# Patient Record
Sex: Female | Born: 1990 | Race: White | Hispanic: No | Marital: Married | Smoking: Former smoker
Health system: Southern US, Community
[De-identification: ages and names within clinical notes are randomized; demographics above are authoritative.]

## PROBLEM LIST (undated history)

## (undated) ENCOUNTER — Inpatient Hospital Stay (HOSPITAL_COMMUNITY): Payer: Self-pay

## (undated) DIAGNOSIS — F329 Major depressive disorder, single episode, unspecified: Secondary | ICD-10-CM

## (undated) DIAGNOSIS — F909 Attention-deficit hyperactivity disorder, unspecified type: Secondary | ICD-10-CM

## (undated) DIAGNOSIS — K219 Gastro-esophageal reflux disease without esophagitis: Secondary | ICD-10-CM

## (undated) DIAGNOSIS — E063 Autoimmune thyroiditis: Secondary | ICD-10-CM

## (undated) DIAGNOSIS — B2790 Infectious mononucleosis, unspecified without complication: Secondary | ICD-10-CM

## (undated) DIAGNOSIS — F32A Depression, unspecified: Secondary | ICD-10-CM

## (undated) DIAGNOSIS — K589 Irritable bowel syndrome without diarrhea: Secondary | ICD-10-CM

## (undated) DIAGNOSIS — Z789 Other specified health status: Secondary | ICD-10-CM

## (undated) HISTORY — DX: Irritable bowel syndrome, unspecified: K58.9

## (undated) HISTORY — DX: Major depressive disorder, single episode, unspecified: F32.9

## (undated) HISTORY — PX: NO PAST SURGERIES: SHX2092

## (undated) HISTORY — PX: WISDOM TOOTH EXTRACTION: SHX21

## (undated) HISTORY — DX: Infectious mononucleosis, unspecified without complication: B27.90

## (undated) HISTORY — DX: Autoimmune thyroiditis: E06.3

## (undated) HISTORY — DX: Depression, unspecified: F32.A

---

## 2013-02-16 DIAGNOSIS — B2790 Infectious mononucleosis, unspecified without complication: Secondary | ICD-10-CM

## 2013-02-16 HISTORY — DX: Infectious mononucleosis, unspecified without complication: B27.90

## 2013-10-13 LAB — CYTOLOGY - PAP: PAP SMEAR: NORMAL

## 2015-05-14 DIAGNOSIS — Z3483 Encounter for supervision of other normal pregnancy, third trimester: Secondary | ICD-10-CM

## 2015-11-07 LAB — OB RESULTS CONSOLE TSH: TSH: 0.19

## 2015-11-07 LAB — OB RESULTS CONSOLE PLATELET COUNT: PLATELETS: 197 10*3/uL

## 2015-11-07 LAB — OB RESULTS CONSOLE GC/CHLAMYDIA
CHLAMYDIA, DNA PROBE: NEGATIVE
GC PROBE AMP, GENITAL: NEGATIVE

## 2015-11-07 LAB — OB RESULTS CONSOLE ABO/RH: RH Type: POSITIVE

## 2015-11-07 LAB — OB RESULTS CONSOLE HGB/HCT, BLOOD
HEMATOCRIT: 41 %
HEMOGLOBIN: 13.8 g/dL

## 2015-11-07 LAB — OB RESULTS CONSOLE RUBELLA ANTIBODY, IGM: Rubella: IMMUNE

## 2015-11-07 LAB — OB RESULTS CONSOLE HIV ANTIBODY (ROUTINE TESTING): HIV: NONREACTIVE

## 2015-11-07 LAB — OB RESULTS CONSOLE ANTIBODY SCREEN: Antibody Screen: NEGATIVE

## 2015-11-07 LAB — OB RESULTS CONSOLE RPR: RPR: NONREACTIVE

## 2015-11-07 LAB — OB RESULTS CONSOLE HEPATITIS B SURFACE ANTIGEN: Hepatitis B Surface Ag: NEGATIVE

## 2015-11-27 LAB — THYROGLOBULIN ANTIBODY: THYROGLOBULIN ANTIBODY: 2.6

## 2016-01-14 ENCOUNTER — Encounter: Payer: Self-pay | Admitting: *Deleted

## 2016-01-16 ENCOUNTER — Encounter: Payer: Self-pay | Admitting: *Deleted

## 2016-01-16 DIAGNOSIS — Z3490 Encounter for supervision of normal pregnancy, unspecified, unspecified trimester: Secondary | ICD-10-CM | POA: Insufficient documentation

## 2016-01-16 DIAGNOSIS — Z3482 Encounter for supervision of other normal pregnancy, second trimester: Secondary | ICD-10-CM

## 2016-01-27 ENCOUNTER — Encounter: Payer: Self-pay | Admitting: Student

## 2016-01-27 ENCOUNTER — Ambulatory Visit (INDEPENDENT_AMBULATORY_CARE_PROVIDER_SITE_OTHER): Payer: BLUE CROSS/BLUE SHIELD | Admitting: Student

## 2016-01-27 ENCOUNTER — Ambulatory Visit (INDEPENDENT_AMBULATORY_CARE_PROVIDER_SITE_OTHER): Payer: BLUE CROSS/BLUE SHIELD | Admitting: Clinical

## 2016-01-27 VITALS — BP 114/60 | HR 69 | Ht 67.0 in | Wt 167.0 lb

## 2016-01-27 DIAGNOSIS — Z3490 Encounter for supervision of normal pregnancy, unspecified, unspecified trimester: Secondary | ICD-10-CM

## 2016-01-27 DIAGNOSIS — F329 Major depressive disorder, single episode, unspecified: Secondary | ICD-10-CM | POA: Insufficient documentation

## 2016-01-27 DIAGNOSIS — E039 Hypothyroidism, unspecified: Secondary | ICD-10-CM

## 2016-01-27 DIAGNOSIS — F330 Major depressive disorder, recurrent, mild: Secondary | ICD-10-CM

## 2016-01-27 DIAGNOSIS — F32A Depression, unspecified: Secondary | ICD-10-CM | POA: Insufficient documentation

## 2016-01-27 DIAGNOSIS — E063 Autoimmune thyroiditis: Secondary | ICD-10-CM

## 2016-01-27 DIAGNOSIS — O99282 Endocrine, nutritional and metabolic diseases complicating pregnancy, second trimester: Secondary | ICD-10-CM

## 2016-01-27 LAB — POCT URINALYSIS DIP (DEVICE)
Bilirubin Urine: NEGATIVE
Glucose, UA: NEGATIVE mg/dL
HGB URINE DIPSTICK: NEGATIVE
Ketones, ur: NEGATIVE mg/dL
NITRITE: NEGATIVE
PH: 7.5 (ref 5.0–8.0)
PROTEIN: 30 mg/dL — AB
Specific Gravity, Urine: 1.02 (ref 1.005–1.030)
UROBILINOGEN UA: 0.2 mg/dL (ref 0.0–1.0)

## 2016-01-27 LAB — TSH: TSH: 0.82 m[IU]/L

## 2016-01-27 LAB — T4, FREE: Free T4: 0.9 ng/dL (ref 0.8–1.8)

## 2016-01-27 LAB — T3, FREE: T3, Free: 2.9 pg/mL (ref 2.3–4.2)

## 2016-01-27 NOTE — Progress Notes (Signed)
Here for first visit. Transferring care from [facility]. New patient education booklets given.  Declines flu shot. Signed up for Babys[employer]s app.

## 2016-01-27 NOTE — Progress Notes (Addendum)
   PRENATAL VISIT NOTE  Subjective:  Valerie Haynes is a 25 y.o. G2P1001 at 6042w1d being seen today for ongoing prenatal care.  She is currently monitored for the following issues for this low-risk pregnancy and has Supervision of normal pregnancy; Depression; and Hashimoto's disease on her problem list.  Patient reports no complaints.  Contractions: Not present. Vag. Bleeding: None.  Movement: Present. Denies leaking of fluid.   The following portions of the patient's history were reviewed and updated as appropriate: allergies, current medications, past family history, past medical history, past social history, past surgical history and problem list. Problem list updated.  Objective:   Vitals:   01/27/16 0817 01/27/16 0835  BP: 114/60   Pulse: 69   Weight: 167 lb (75.8 kg)   Height:  5\' 7"  (1.702 m)    Fetal Status: Fetal Heart Rate (bpm): 154 Fundal Height: 21 cm Movement: Present     General:  Alert, oriented and cooperative. Patient is in no acute distress.  Skin: Skin is warm and dry. No rash noted.   Cardiovascular: Normal heart rate noted  Respiratory: Normal respiratory effort, no problems with respiration noted  Abdomen: Soft, gravid, appropriate for gestational age. Pain/Pressure: Absent     Pelvic:  Cervical exam deferred        Extremities: Normal range of motion.  Edema: None  Mental Status: Normal mood and affect. Normal behavior. Normal judgment and thought content.   Assessment and Plan:  Pregnancy: G2P1001 at 5342w1d  1. Encounter for supervision of normal pregnancy, antepartum, unspecified gravidity -Feels good - US MFM OB COMP + 14 WK; Future (on 01/27/2017) - Pain Mgmt, Profile 6 Conf w/o mM, U  2. Mild episode of recurrent major depressive disorder (HCC) -saw Asher MuirJamie today; will discuss finding a therapist  3. Hypothyroidism affecting pregnancy in second trimester -possible Hypothyroidism, will wait to see what labs on 12/11 show 4. Hashimoto's  disease Patient has been diagnosed with Hashimotos but not yet hypothyroidism. Will draw thyroid labs today. (TSH, T3, free, T4) -continue to take ASA  Patient's thyroid testing results are as follows (abstracted from prenatal records)  1. 11/07/2015 TSH 0.19 T4 1.25  2. 11/27/2015 Thyroid antibodies: 2.6    Preterm labor symptoms and general obstetric precautions including but not limited to vaginal bleeding, contractions, leaking of fluid and fetal movement were reviewed in detail with the patient. Please refer to After Visit Summary for other counseling recommendations.  Return in about 3 weeks (around 02/17/2016) for next visit fasting 2 hr gtt/ obfu.   Marylene LandKathryn Lorraine Kooistra, CNM

## 2016-01-27 NOTE — BH Specialist Note (Addendum)
Session Start time: 9:30   End Time: 9:46 Total Time:  16 minutes Type of Service: Behavioral Health - Individual/Family Interpreter: No.   Interpreter Name & Language: n/a # [facility]   SUBJECTIVE: Valerie Haynes is a 25 y.o. female  Pt. was referred by Luna KitchensKathryn Kooistra, CNM for:  depression. Pt. reports the following symptoms/concerns: Pt states that she wants to find out options for therapy in the area, as DBT was most helpful in the past; pt open to additional educational resources concerning coping with symptoms of depression(and mild anxiety) associated with overeating.Pt somewhat stressed over move to [place], and not having social support with second pregnancy and toddler at home. 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 Pt may benefit from psychoeducation and brief therapeutic interventions regarding coping with symptoms of depression and anxiety.      PLAN: 1. F/U with behavioral health clinician: As needed 2. Behavioral Health meds: none 3. Behavioral recommendations:  -Follow-up on previous referral to DBT therapist in area -Contact [employer] to ask for list of BH providers in area -Read educational material regarding coping with symptoms of  anxiety and depression -Consider [facility] and [facility] support classes for additional social support 4. Referral: Brief Counseling/Psychotherapy, Publishing rights managerCommunity Resource and Psychoeducation 5. From scale of 1-10, how likely are you to follow plan: 9   Rae LipsJamie C Mcmannes LCSWA Behavioral Health Clinician  Warmhandoff:   Warm Hand Off Completed.        Depression screen PHQ 2/9 01/27/2016  Decreased Interest 1  Down, Depressed, Hopeless 1  PHQ - 2 Score 2  Altered sleeping 1  Tired, decreased energy 2  Change in appetite 2  Feeling bad or failure about yourself  1  Trouble concentrating 1  Moving slowly or fidgety/restless 0  Suicidal thoughts 0  PHQ-9 Score 9   GAD 7 : Generalized Anxiety Score 01/27/2016  Nervous, Anxious, on Edge 1  Control/stop worrying 0  Worry too much - different things 0  Trouble relaxing 0  Restless 0  Easily annoyed or irritable 1  Afraid - awful might happen 2  Total GAD 7 Score 4

## 2016-01-27 NOTE — Patient Instructions (Signed)
AREA PEDIATRIC/FAMILY PRACTICE PHYSICIANS  [facility] [address], [address] [place], [place]  [address] Phone - [phone]   Fax - [phone]  [facility] [address] [place], [place] [address] Phone - [phone]   Fax - [phone]  [facility] [address] [place], [place]  [address] Phone - [phone]   Fax - [phone]  [facility] [address] [place], [place]  [address] Phone - [phone]   Fax - [phone]  [facility] [address] [place], [place]  [address] Phone - [phone]   Fax - [phone]  [facility] [address] [place], [place]  [address] Phone - [phone]   Fax - [phone]  [facility] [address] [place], [place]  [address] Phone - [phone]   Fax - [phone]  [facility] [address] [place], [place]  [address] Phone - [phone]   Fax - [phone]  [facility] [address] [place], [place]  [address] Phone - [phone]   Fax - [phone] [facility] [address] [place], [place]  [address] Phone - [phone]   Fax - [phone]  [facility] [address] [place], [place]  [address] Phone - [phone]   Fax - [phone]  [facility] [address] [place], [place]  [address] Phone - [phone]   Fax - [phone]  [facility] [address] [place], [place]  [address] Phone - [phone]   Fax - [phone]  [facility] [address] [place], [place]  [address] Phone - [phone]  [facility] [address] [place], [place]  [address] Phone - [phone]   Fax - [phone]  [facility] [address] [place], [place]  [address] Phone - [phone]   Fax - [phone]  [facility] [address] [place], [place]  [address] Phone - [phone]   Fax - [phone]  [facility] [address] [place], [place]  [address] Phone - [phone]   Fax - [phone]   [facility] [address] [place], [place]  [address] Phone - [phone]   Fax - [phone]  [facility] [address] [place], [place]  [address] Phone - [phone]   Fax - [phone]  [facility] [address] [place], [place]  [address] Phone - [phone]   Fax - [phone]  [facility] [address] [place], [place]  [address] Phone - [phone]   Fax - [phone]  [facility] [address] , [place]  [address] Phone - [phone]   Fax - [phone] [facility] [address] [place], [place]  [address] Phone - [phone]   Fax - [phone]  [facility] [address] [place], [place]  [address] Phone - [phone]   Fax - [phone]  [facility] [address] [place], [place]  [address] Phone - [phone]   Fax - [phone]   

## 2016-01-28 ENCOUNTER — Other Ambulatory Visit: Payer: Self-pay | Admitting: Student

## 2016-01-28 ENCOUNTER — Ambulatory Visit (HOSPITAL_COMMUNITY)
Admission: RE | Admit: 2016-01-28 | Discharge: 2016-01-28 | Disposition: A | Discharge status: Home / Self Care | Payer: BLUE CROSS/BLUE SHIELD | Source: Ambulatory Visit | Attending: Student | Admitting: Student

## 2016-01-28 DIAGNOSIS — Z3A21 21 weeks gestation of pregnancy: Secondary | ICD-10-CM | POA: Insufficient documentation

## 2016-01-28 DIAGNOSIS — O358XX0 Maternal care for other (suspected) fetal abnormality and damage, not applicable or unspecified: Secondary | ICD-10-CM | POA: Diagnosis not present

## 2016-01-28 DIAGNOSIS — Z363 Encounter for antenatal screening for malformations: Secondary | ICD-10-CM | POA: Diagnosis present

## 2016-01-28 DIAGNOSIS — Z3490 Encounter for supervision of normal pregnancy, unspecified, unspecified trimester: Secondary | ICD-10-CM

## 2016-01-28 DIAGNOSIS — O359XX0 Maternal care for (suspected) fetal abnormality and damage, unspecified, not applicable or unspecified: Secondary | ICD-10-CM

## 2016-01-28 LAB — PAIN MGMT, PROFILE 6 CONF W/O MM, U
6 ACETYLMORPHINE: NEGATIVE ng/mL (ref <10)
ALCOHOL METABOLITES: NEGATIVE ng/mL (ref <500)
Amphetamines: NEGATIVE ng/mL (ref <500)
BARBITURATES: NEGATIVE ng/mL (ref <300)
Benzodiazepines: NEGATIVE ng/mL (ref <100)
COCAINE METABOLITE: NEGATIVE ng/mL (ref <150)
CREATININE: 134.4 mg/dL (ref >=20.0)
METHADONE METABOLITE: NEGATIVE ng/mL (ref <100)
Marijuana Metabolite: NEGATIVE ng/mL (ref <20)
OPIATES: NEGATIVE ng/mL (ref <100)
Oxidant: NEGATIVE ug/mL (ref <200)
Oxycodone: NEGATIVE ng/mL (ref <100)
PH: 7.62 (ref 4.5–9.0)
Phencyclidine: NEGATIVE ng/mL (ref <25)
Please note:: 0

## 2016-01-31 NOTE — Addendum Note (Signed)
Addended by: Marlis EdelsonKARIM, Rage Beever N on: 01/31/2016 11:48 AM   Modules accepted: Kipp BroodSmartSet

## 2016-02-03 ENCOUNTER — Encounter (HOSPITAL_COMMUNITY): Payer: Self-pay | Admitting: *Deleted

## 2016-02-03 ENCOUNTER — Inpatient Hospital Stay (HOSPITAL_COMMUNITY): Payer: BLUE CROSS/BLUE SHIELD

## 2016-02-03 ENCOUNTER — Inpatient Hospital Stay (HOSPITAL_COMMUNITY)
Admission: AD | Admit: 2016-02-03 | Discharge: 2016-02-03 | Disposition: A | Discharge status: Home / Self Care | Payer: BLUE CROSS/BLUE SHIELD | Source: Ambulatory Visit | Attending: Family Medicine | Admitting: Family Medicine

## 2016-02-03 DIAGNOSIS — Y92414 Local residential or business street as the place of occurrence of the external cause: Secondary | ICD-10-CM | POA: Insufficient documentation

## 2016-02-03 DIAGNOSIS — Z7982 Long term (current) use of aspirin: Secondary | ICD-10-CM | POA: Insufficient documentation

## 2016-02-03 DIAGNOSIS — O26892 Other specified pregnancy related conditions, second trimester: Secondary | ICD-10-CM | POA: Insufficient documentation

## 2016-02-03 DIAGNOSIS — Z87891 Personal history of nicotine dependence: Secondary | ICD-10-CM | POA: Diagnosis not present

## 2016-02-03 DIAGNOSIS — S3991XA Unspecified injury of abdomen, initial encounter: Secondary | ICD-10-CM

## 2016-02-03 DIAGNOSIS — O9A212 Injury, poisoning and certain other consequences of external causes complicating pregnancy, second trimester: Secondary | ICD-10-CM

## 2016-02-03 DIAGNOSIS — Z3A22 22 weeks gestation of pregnancy: Secondary | ICD-10-CM | POA: Insufficient documentation

## 2016-02-03 NOTE — Discharge Instructions (Signed)
What Do I Need to Know About Injuries During Pregnancy? °Trauma is the most common cause of injury and death in pregnant women. This can also result in significant harm or death of the baby. °Your baby is protected in the womb (uterus) by a sac filled with fluid (amniotic sac). Your baby can be harmed if there is direct, high-impact trauma to your abdomen and pelvis. This type of trauma can result in tearing of your uterus, the placenta pulling away from the wall of the uterus (placenta abruption), or the amniotic sac breaking open (rupture of membranes). These injuries can decrease or stop the blood supply to your baby or cause you to go into labor earlier than expected. Minor falls and low-impact automobile accidents do not usually harm your baby, even if they do minimally harm you. °WHAT KIND OF INJURIES CAN AFFECT MY PREGNANCY? °The most common causes of injury or death to a baby include: °· Falls. Falls are more common in the second and third trimester of the pregnancy. Factors that increase your risk of falling include: °¨ Increase in your weight. °¨ The change in your center of gravity. °¨ Tripping over an object that cannot be seen. °¨ Increased looseness (laxity) of your ligaments resulting in less coordinated movements (you may feel clumsy). °¨ Falling during high-risk activities like horseback riding or skiing. °· Automobile accidents. It is important to wear your seat belt properly, with the lap belt below your abdomen, and always practice safe driving. °· Domestic violence or assault. °· Burns (fire or electrical). °The most common causes of injury or death to the pregnant woman include: °· Injuries that cause severe bleeding, shock, and loss of blood flow to major organs. °· Head and neck injuries that result in severe brain or spinal damage. °· Chest trauma that can cause direct injury to the heart and lungs or any injury that affects the area enclosed by the ribs. Trauma to this area can result in  cardiorespiratory arrest. °WHAT CAN I DO TO PROTECT MYSELF AND MY BABY FROM INJURY WHILE I AM PREGNANT? °· Remove slippery rugs and loose objects on the floor that increase your risk of tripping. °· Avoid walking on wet or slippery floors. °· Wear comfortable shoes that have a good grip on the sole. Do not wear high-heeled shoes. °· Always wear your seat belt properly, with the lap belt below your abdomen, and always practice safe driving. Do not ride on a motorcycle while pregnant. °· Do not participate in high-impact activities or sports. °· Avoid fires, starting fires, lifting heavy pots of boiling or hot liquids, and fixing electrical problems. °· Only take over-the-counter or prescription medicines for pain, fever, or discomfort as directed by your health care provider. °· Know your blood type and the father's blood type in case you develop vaginal bleeding or experience an injury for which a blood transfusion may be necessary. °· Call your local emergency services (911 in the [place].) if you are a victim of domestic violence or assault. Spousal abuse can be a significant cause of trauma during pregnancy. For help and support, contact the National Domestic Violence Hotline. °WHEN SHOULD I SEEK IMMEDIATE MEDICAL CARE?  °· You fall on your abdomen or experience any high-force accident or injury. °· You have been assaulted (domestic or otherwise). °· You have been in a car accident. °· You develop vaginal bleeding. °· You develop fluid leaking from the vagina. °· You develop uterine contractions (pelvic cramping, pain, or significant low back   pain). °· You become weak or faint, or have uncontrolled vomiting after trauma. °· You had a serious burn. This includes burns to the face, neck, hands, or genitals, or burns greater than the size of your palm anywhere else. °· You develop neck stiffness or pain after a fall or from other trauma. °· You develop a headache or vision problems after a fall or from other  trauma. °· You do not feel the baby moving or the baby is not moving as much as before a fall or other trauma. °This information is not intended to replace advice given to you by your health care provider. Make sure you discuss any questions you have with your health care provider. °Document Released: 03/12/2004 Document Revised: 02/23/2014 Document Reviewed: 11/09/2012 °Elsevier Interactive Patient Education © 2017 Elsevier Inc. ° °
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
## 2016-02-04 ENCOUNTER — Telehealth: Payer: Self-pay | Admitting: *Deleted

## 2016-02-04 NOTE — Telephone Encounter (Signed)
Valerie Haynes left a voicemail yesterday am stating she wants to speak with the doctor who looked at her US.
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
## 2016-02-05 ENCOUNTER — Telehealth: Payer: Self-pay | Admitting: Student

## 2016-02-12 ENCOUNTER — Telehealth: Payer: Self-pay | Admitting: *Deleted

## 2016-02-12 DIAGNOSIS — Z3490 Encounter for supervision of normal pregnancy, unspecified, unspecified trimester: Secondary | ICD-10-CM

## 2016-02-12 NOTE — Telephone Encounter (Signed)
Per Dorene SorrowK. Koostrya, CNM need to schedule 4 week fu US and appt to discuss results.

## 2016-02-12 NOTE — Telephone Encounter (Signed)
Called and scheduled 1/17 2:15 pm. Valerie Lundalled Valerie Haynes to notify and was unable to leave a message- heard a message voice mail is full. Will also send message to admin pool to schedule obfu after us appt. To discuss results.

## 2016-02-17 NOTE — L&D Delivery Note (Signed)
Delivery Note At 7:36 PM a viable female was delivered via Vaginal, Spontaneous Delivery (Presentation:ROA).  APGAR: 9, 9; weight pending.   Placenta status: Spontaneous, intact.  Cord: 3 vessels.  Anesthesia: None   Episiotomy: None Lacerations: second degree  Suture Repair: 2.0 vicryl Est. Blood Loss (mL):  200  Mom to postpartum.  Baby to Couplet care / Skin to Skin.  Cleone Slim 06/07/2016, 8:40 PM

## 2016-02-18 ENCOUNTER — Ambulatory Visit (INDEPENDENT_AMBULATORY_CARE_PROVIDER_SITE_OTHER): Payer: BLUE CROSS/BLUE SHIELD | Admitting: Student

## 2016-02-18 VITALS — BP 100/51 | HR 71 | Wt 170.6 lb

## 2016-02-18 DIAGNOSIS — O99282 Endocrine, nutritional and metabolic diseases complicating pregnancy, second trimester: Secondary | ICD-10-CM

## 2016-02-18 DIAGNOSIS — O283 Abnormal ultrasonic finding on antenatal screening of mother: Secondary | ICD-10-CM | POA: Insufficient documentation

## 2016-02-18 DIAGNOSIS — Z3400 Encounter for supervision of normal first pregnancy, unspecified trimester: Secondary | ICD-10-CM

## 2016-02-18 DIAGNOSIS — E063 Autoimmune thyroiditis: Secondary | ICD-10-CM

## 2016-02-18 DIAGNOSIS — Z3482 Encounter for supervision of other normal pregnancy, second trimester: Secondary | ICD-10-CM

## 2016-02-18 LAB — CBC
HCT: 32.2 % — ABNORMAL LOW (ref 35.0–45.0)
Hemoglobin: 10.6 g/dL — ABNORMAL LOW (ref 11.7–15.5)
MCH: 29.5 pg (ref 27.0–33.0)
MCHC: 32.9 g/dL (ref 32.0–36.0)
MCV: 89.7 fL (ref 80.0–100.0)
MPV: 11.2 fL (ref 7.5–12.5)
PLATELETS: 190 10*3/uL (ref 140–400)
RBC: 3.59 MIL/uL — ABNORMAL LOW (ref 3.80–5.10)
RDW: 14.1 % (ref 11.0–15.0)
WBC: 10 10*3/uL (ref 3.8–10.8)

## 2016-02-18 NOTE — Progress Notes (Signed)
   PRENATAL VISIT NOTE  Subjective:  Valerie DoughtyKelly Mellin is a 26 y.o. G2P1001 at 4463w6d being seen today for ongoing prenatal care.  She is currently monitored for the following issues for this low-risk pregnancy and has Supervision of normal pregnancy; Depression; and Hashimoto's disease on her problem list.  Patient reports no complaints.  Contractions: Not present. Vag. Bleeding: None.  Movement: Present. Denies leaking of fluid.   The following portions of the patient's history were reviewed and updated as appropriate: allergies, current medications, past family history, past medical history, past social history, past surgical history and problem list. Problem list updated.  Objective:   Vitals:   02/18/16 0747  BP: (!) 100/51  Pulse: 71  Weight: 170 lb 9.6 oz (77.4 kg)    Fetal Status: Fetal Heart Rate (bpm): 143   Movement: Present     General:  Alert, oriented and cooperative. Patient is in no acute distress.  Skin: Skin is warm and dry. No rash noted.   Cardiovascular: Normal heart rate noted  Respiratory: Normal respiratory effort, no problems with respiration noted  Abdomen: Soft, gravid, appropriate for gestational age. Pain/Pressure: Present     Pelvic:  Cervical exam deferred        Extremities: Normal range of motion.     Mental Status: Normal mood and affect. Normal behavior. Normal judgment and thought content.   Assessment and Plan:  Pregnancy: G2P1001 at 8463w6d  1. Supervision of normal first pregnancy, antepartum Patient is doing well. TSH was 0.83 at 22 weeks; will repeat in 3rd trimester. Not seeking counseling at this point for her depression but feels she is managing well with support from family and friends.  - 2Hr GTT w/ 1 Hr Carpenter 75 g - RPR - CBC - HIV antibody (with reflex)  2. Patient has appointment on 03-04-16 for follow up MFM US to assess renal pyelectasis.   Preterm labor symptoms and general obstetric precautions including but not limited to  vaginal bleeding, contractions, leaking of fluid and fetal movement were reviewed in detail with the patient. Please refer to After Visit Summary for other counseling recommendations.   Return in about 2 weeks (around 03/03/2016) for Ob follow up.   Marylene LandKathryn Lorraine Sonja Manseau, CNM

## 2016-02-19 LAB — RPR

## 2016-02-19 LAB — 2HR GTT W 1 HR, CARPENTER, 75 G
GLUCOSE, 1 HR, GEST: 78 mg/dL (ref <180)
GLUCOSE, 2 HR, GEST: 81 mg/dL (ref <153)
Glucose, Fasting, Gest: 77 mg/dL (ref 65–91)

## 2016-02-19 LAB — HIV ANTIBODY (ROUTINE TESTING W REFLEX): HIV 1&2 Ab, 4th Generation: NONREACTIVE

## 2016-02-20 NOTE — Telephone Encounter (Signed)
Called Tresa EndoKelly to make sure she was notified at her ob visit. She voices understanding and we reviewed her appointments. Also reviewed lab results from 02/18/16 at her request.

## 2016-03-04 ENCOUNTER — Ambulatory Visit (HOSPITAL_COMMUNITY): Admission: RE | Admit: 2016-03-04 | Payer: No Typology Code available for payment source | Source: Ambulatory Visit

## 2016-03-04 ENCOUNTER — Encounter: Payer: Self-pay | Admitting: Family Medicine

## 2016-03-09 ENCOUNTER — Other Ambulatory Visit (HOSPITAL_COMMUNITY): Payer: Self-pay | Admitting: *Deleted

## 2016-03-09 ENCOUNTER — Ambulatory Visit (HOSPITAL_COMMUNITY)
Admission: RE | Admit: 2016-03-09 | Discharge: 2016-03-09 | Disposition: A | Discharge status: Home / Self Care | Payer: BLUE CROSS/BLUE SHIELD | Source: Ambulatory Visit | Attending: Student | Admitting: Student

## 2016-03-09 ENCOUNTER — Encounter (HOSPITAL_COMMUNITY): Payer: Self-pay

## 2016-03-09 ENCOUNTER — Encounter: Payer: Self-pay | Admitting: Obstetrics & Gynecology

## 2016-03-09 DIAGNOSIS — O35EXX0 Maternal care for other (suspected) fetal abnormality and damage, fetal genitourinary anomalies, not applicable or unspecified: Secondary | ICD-10-CM

## 2016-03-09 DIAGNOSIS — O358XX0 Maternal care for other (suspected) fetal abnormality and damage, not applicable or unspecified: Secondary | ICD-10-CM | POA: Diagnosis not present

## 2016-03-09 DIAGNOSIS — Z3A27 27 weeks gestation of pregnancy: Secondary | ICD-10-CM | POA: Diagnosis not present

## 2016-03-09 DIAGNOSIS — Z3490 Encounter for supervision of normal pregnancy, unspecified, unspecified trimester: Secondary | ICD-10-CM

## 2016-03-16 ENCOUNTER — Telehealth: Payer: Self-pay | Admitting: *Deleted

## 2016-03-16 NOTE — Telephone Encounter (Signed)
Called patient and let her know to continue with her baby asa till 36 weeks. Understanding voiced.

## 2016-03-16 NOTE — Telephone Encounter (Signed)
Patient called with question about taking baby asa which was prescribed by another office. She has run out and wants to know if she should continue taking. Per Dr Adrian BlackwaterStinson she should continue till [redacted] weeks gestation.

## 2016-03-17 ENCOUNTER — Ambulatory Visit (INDEPENDENT_AMBULATORY_CARE_PROVIDER_SITE_OTHER): Payer: BLUE CROSS/BLUE SHIELD | Admitting: Certified Nurse Midwife

## 2016-03-17 VITALS — BP 106/60 | HR 79 | Wt 175.7 lb

## 2016-03-17 DIAGNOSIS — E063 Autoimmune thyroiditis: Secondary | ICD-10-CM

## 2016-03-17 DIAGNOSIS — Z3483 Encounter for supervision of other normal pregnancy, third trimester: Secondary | ICD-10-CM

## 2016-03-17 DIAGNOSIS — Z23 Encounter for immunization: Secondary | ICD-10-CM | POA: Diagnosis not present

## 2016-03-17 DIAGNOSIS — O283 Abnormal ultrasonic finding on antenatal screening of mother: Secondary | ICD-10-CM

## 2016-03-17 LAB — TSH: TSH: 0.86 mIU/L

## 2016-03-17 NOTE — Addendum Note (Signed)
Addended by: Donette LarryBHAMBRI, Caoimhe Damron E on: 03/17/2016 10:13 AM   Modules accepted: Orders

## 2016-03-17 NOTE — Progress Notes (Signed)
Subjective:  Valerie DoughtyKelly Haynes is a 26 y.o. G2P1001 at 5138w6d being seen today for ongoing prenatal care.  She is currently monitored for the following issues for this low-risk pregnancy and has Supervision of normal pregnancy; Depression; Hashimoto's disease; and Abnormal pregnancy US on her problem list.  Patient reports no complaints.  Contractions: Not present. Vag. Bleeding: None.  Movement: Present. Denies leaking of fluid.   The following portions of the patient's history were reviewed and updated as appropriate: allergies, current medications, past family history, past medical history, past social history, past surgical history and problem list. Problem list updated.  Objective:   Vitals:   03/17/16 0934  BP: 106/60  Pulse: 79  Weight: 175 lb 11.2 oz (79.7 kg)    Fetal Status: Fetal Heart Rate (bpm): 135 Fundal Height: 28 cm Movement: Present  Presentation: Vertex  General:  Alert, oriented and cooperative. Patient is in no acute distress.  Skin: Skin is warm and dry. No rash noted.   Cardiovascular: Normal heart rate noted  Respiratory: Normal respiratory effort, no problems with respiration noted  Abdomen: Soft, gravid, appropriate for gestational age. Pain/Pressure: Present     Pelvic: Vag. Bleeding: None Vag D/C Character: White   Cervical exam deferred        Extremities: Normal range of motion.  Edema: None  Mental Status: Normal mood and affect. Normal behavior. Normal judgment and thought content.   Urinalysis:      Assessment and Plan:  Pregnancy: G2P1001 at 6938w6d  1. Encounter for supervision of other normal pregnancy in third trimester - interested in waterbirth, discussed class requirements-bring certificate - Tdap vaccine greater than or equal to 7yo IM  2. Hashimoto's disease - TSH - TgAb+Thyroglobulin IMA or LCMS  3. Abnormal pregnancy US - f/u US in 6 weeks  Preterm labor symptoms and general obstetric precautions including but not limited to vaginal  bleeding, contractions, leaking of fluid and fetal movement were reviewed in detail with the patient. Please refer to After Visit Summary for other counseling recommendations.  Return in about 2 weeks (around 03/31/2016).   Donette LarryMelanie Norina Cowper, CNM

## 2016-03-18 LAB — THYROGLOBULIN ANTIBODY: THYROGLOBULIN AB: 2 [IU]/mL — AB (ref <2)

## 2016-04-02 ENCOUNTER — Ambulatory Visit (INDEPENDENT_AMBULATORY_CARE_PROVIDER_SITE_OTHER): Payer: BLUE CROSS/BLUE SHIELD | Admitting: Student

## 2016-04-02 VITALS — BP 109/67 | HR 90 | Wt 173.4 lb

## 2016-04-02 DIAGNOSIS — Z3493 Encounter for supervision of normal pregnancy, unspecified, third trimester: Secondary | ICD-10-CM | POA: Insufficient documentation

## 2016-04-02 NOTE — Patient Instructions (Signed)
Third Trimester of Pregnancy The third trimester is from week 29 through week 40 (months 7 through 9). The third trimester is a time when the unborn baby (fetus) is growing rapidly. At the end of the ninth month, the fetus is about 20 inches in length and weighs 6-10 pounds. Body changes during your third trimester Your body goes through many changes during pregnancy. The changes vary from woman to woman. During the third trimester:  Your weight will continue to increase. You can expect to gain 25-35 pounds (11-16 kg) by the end of the pregnancy.  You may begin to get stretch marks on your hips, abdomen, and breasts.  You may urinate more often because the fetus is moving lower into your pelvis and pressing on your bladder.  You may develop or continue to have heartburn. This is caused by increased hormones that slow down muscles in the digestive tract.  You may develop or continue to have constipation because increased hormones slow digestion and cause the muscles that push waste through your intestines to relax.  You may develop hemorrhoids. These are swollen veins (varicose veins) in the rectum that can itch or be painful.  You may develop swollen, bulging veins (varicose veins) in your legs.  You may have increased body aches in the pelvis, back, or thighs. This is due to weight gain and increased hormones that are relaxing your joints.  You may have changes in your hair. These can include thickening of your hair, rapid growth, and changes in texture. Some women also have hair loss during or after pregnancy, or hair that feels dry or thin. Your hair will most likely return to normal after your baby is born.  Your breasts will continue to grow and they will continue to become tender. A yellow fluid (colostrum) may leak from your breasts. This is the first milk you are producing for your baby.  Your belly button may stick out.  You may notice more swelling in your hands, face, or  ankles.  You may have increased tingling or numbness in your hands, arms, and legs. The skin on your belly may also feel numb.  You may feel short of breath because of your expanding uterus.  You may have more problems sleeping. This can be caused by the size of your belly, increased need to urinate, and an increase in your body's metabolism.  You may notice the fetus "dropping," or moving lower in your abdomen.  You may have increased vaginal discharge.  Your cervix becomes thin and soft (effaced) near your due date. What to expect at prenatal visits You will have prenatal exams every 2 weeks until week 36. Then you will have weekly prenatal exams. During a routine prenatal visit:  You will be weighed to make sure you and the fetus are growing normally.  Your blood pressure will be taken.  Your abdomen will be measured to track your baby's growth.  The fetal heartbeat will be listened to.  Any test results from the previous visit will be discussed.  You may have a cervical check near your due date to see if you have effaced. At around 36 weeks, your health care provider will check your cervix. At the same time, your health care provider will also perform a test on the secretions of the vaginal tissue. This test is to determine if a type of bacteria, Group B streptococcus, is present. Your health care provider will explain this further. Your health care provider may ask you:    What your birth plan is.  How you are feeling.  If you are feeling the baby move.  If you have had any abnormal symptoms, such as leaking fluid, bleeding, severe headaches, or abdominal cramping.  If you are using any tobacco products, including cigarettes, chewing tobacco, and electronic cigarettes.  If you have any questions. Other tests or screenings that may be performed during your third trimester include:  Blood tests that check for low iron levels (anemia).  Fetal testing to check the health,  activity level, and growth of the fetus. Testing is done if you have certain medical conditions or if there are problems during the pregnancy.  Nonstress test (NST). This test checks the health of your baby to make sure there are no signs of problems, such as the baby not getting enough oxygen. During this test, a belt is placed around your belly. The baby is made to move, and its heart rate is monitored during movement. What is false labor? False labor is a condition in which you feel small, irregular tightenings of the muscles in the womb (contractions) that eventually go away. These are called Braxton Hicks contractions. Contractions may last for hours, days, or even weeks before true labor sets in. If contractions come at regular intervals, become more frequent, increase in intensity, or become painful, you should see your health care provider. What are the signs of labor?  Abdominal cramps.  Regular contractions that start at 10 minutes apart and become stronger and more frequent with time.  Contractions that start on the top of the uterus and spread down to the lower abdomen and back.  Increased pelvic pressure and dull back pain.  A watery or bloody mucus discharge that comes from the vagina.  Leaking of amniotic fluid. This is also known as your "water breaking." It could be a slow trickle or a gush. Let your doctor know if it has a color or strange odor. If you have any of these signs, call your health care provider right away, even if it is before your due date. Follow these instructions at home: Eating and drinking  Continue to eat regular, healthy meals.  Do not eat:  Raw meat or meat spreads.  Unpasteurized milk or cheese.  Unpasteurized juice.  Store-made salad.  Refrigerated smoked seafood.  Hot dogs or deli meat, unless they are piping hot.  More than 6 ounces of albacore tuna a week.  Shark, swordfish, king mackerel, or tile fish.  Store-made salads.  Raw  sprouts, such as mung bean or alfalfa sprouts.  Take prenatal vitamins as told by your health care provider.  Take 1000 mg of calcium daily as told by your health care provider.  If you develop constipation:  Take over-the-counter or prescription medicines.  Drink enough fluid to keep your urine clear or pale yellow.  Eat foods that are high in fiber, such as fresh fruits and vegetables, whole grains, and beans.  Limit foods that are high in fat and processed sugars, such as fried and sweet foods. Activity  Exercise only as directed by your health care provider. Healthy pregnant women should aim for 2 hours and 30 minutes of moderate exercise per week. If you experience any pain or discomfort while exercising, stop.  Avoid heavy lifting.  Do not exercise in extreme heat or humidity, or at high altitudes.  Wear low-heel, comfortable shoes.  Practice good posture.  Do not travel far distances unless it is absolutely necessary and only with the approval   of your health care provider.  Wear your seat belt at all times while in a car, on a bus, or on a plane.  Take frequent breaks and rest with your legs elevated if you have leg cramps or low back pain.  Do not use hot tubs, steam rooms, or saunas.  You may continue to have sex unless your health care provider tells you otherwise. Lifestyle  Do not use any products that contain nicotine or tobacco, such as cigarettes and e-cigarettes. If you need help quitting, ask your health care provider.  Do not drink alcohol.  Do not use any medicinal herbs or unprescribed drugs. These chemicals affect the formation and growth of the baby.  If you develop varicose veins:  Wear support pantyhose or compression stockings as told by your healthcare provider.  Elevate your feet for 15 minutes, 3-4 times a day.  Wear a supportive maternity bra to help with breast tenderness. General instructions  Take over-the-counter and prescription  medicines only as told by your health care provider. There are medicines that are either safe or unsafe to take during pregnancy.  Take warm sitz baths to soothe any pain or discomfort caused by hemorrhoids. Use hemorrhoid cream or witch hazel if your health care provider approves.  Avoid cat litter boxes and soil used by cats. These carry germs that can cause birth defects in the baby. If you have a cat, ask someone to clean the litter box for you.  To prepare for the arrival of your baby:  Take prenatal classes to understand, practice, and ask questions about the labor and delivery.  Make a trial run to the hospital.  Visit the hospital and tour the maternity area.  Arrange for maternity or paternity leave through employers.  Arrange for family and friends to take care of pets while you are in the hospital.  Purchase a rear-facing car seat and make sure you know how to install it in your car.  Pack your hospital bag.  Prepare the baby's nursery. Make sure to remove all pillows and stuffed animals from the baby's crib to prevent suffocation.  Visit your dentist if you have not gone during your pregnancy. Use a soft toothbrush to brush your teeth and be gentle when you floss.  Keep all prenatal follow-up visits as told by your health care provider. This is important. Contact a health care provider if:  You are unsure if you are in labor or if your water has broken.  You become dizzy.  You have mild pelvic cramps, pelvic pressure, or nagging pain in your abdominal area.  You have lower back pain.  You have persistent nausea, vomiting, or diarrhea.  You have an unusual or bad smelling vaginal discharge.  You have pain when you urinate. Get help right away if:  You have a fever.  You are leaking fluid from your vagina.  You have spotting or bleeding from your vagina.  You have severe abdominal pain or cramping.  You have rapid weight loss or weight gain.  You have  shortness of breath with chest pain.  You notice sudden or extreme swelling of your face, hands, ankles, feet, or legs.  Your baby makes fewer than 10 movements in 2 hours.  You have severe headaches that do not go away with medicine.  You have vision changes. Summary  The third trimester is from week 29 through week 40, months 7 through 9. The third trimester is a time when the unborn baby (fetus)   is growing rapidly.  During the third trimester, your discomfort may increase as you and your baby continue to gain weight. You may have abdominal, leg, and back pain, sleeping problems, and an increased need to urinate.  During the third trimester your breasts will keep growing and they will continue to become tender. A yellow fluid (colostrum) may leak from your breasts. This is the first milk you are producing for your baby.  False labor is a condition in which you feel small, irregular tightenings of the muscles in the womb (contractions) that eventually go away. These are called Braxton Hicks contractions. Contractions may last for hours, days, or even weeks before true labor sets in.  Signs of labor can include: abdominal cramps; regular contractions that start at 10 minutes apart and become stronger and more frequent with time; watery or bloody mucus discharge that comes from the vagina; increased pelvic pressure and dull back pain; and leaking of amniotic fluid. This information is not intended to replace advice given to you by your health care provider. Make sure you discuss any questions you have with your health care provider. Document Released: 01/27/2001 Document Revised: 07/11/2015 Document Reviewed: 04/05/2012 [employer] Interactive Patient Education  2017 [employer]  

## 2016-04-02 NOTE — Progress Notes (Addendum)
   PRENATAL VISIT NOTE  Subjective:  Valerie Haynes is a 26 y.o. G2P1001 at 1145w1d being seen today for ongoing prenatal care.  She is currently monitored for the following issues for this low-risk pregnancy and has Supervision of normal pregnancy; Depression; Hashimoto's disease; Abnormal pregnancy US; and Prenatal care in third trimester on her problem list.  Patient reports occasional pain in the morning when she wakes up. The pain is on her right side and it goes away after getting up. She also recently had the gi bug and lost two pounds. She feels like her mood is good. It is up and down and she feels better when talking with her husband. Doesn't feel the need to see Valerie Haynes today and says she has the list of therapists if she needs to start therapy. .  Contractions: Not present. Vag. Bleeding: None.  Movement: Present. Denies leaking of fluid.   The following portions of the patient's history were reviewed and updated as appropriate: allergies, current medications, past family history, past medical history, past social history, past surgical history and problem list. Problem list updated.  Objective:   Vitals:   04/02/16 1003  BP: 109/67  Pulse: 90  Weight: 78.7 kg (173 lb 6.4 oz)    Fetal Status: Fetal Heart Rate (bpm): 150 Fundal Height: 31 cm Movement: Present     General:  Alert, oriented and cooperative. Patient is in no acute distress.  Skin: Skin is warm and dry. No rash noted.   Cardiovascular: Normal heart rate noted  Respiratory: Normal respiratory effort, no problems with respiration noted  Abdomen: Soft, gravid, appropriate for gestational age. Pain/Pressure: Present     Pelvic:  Cervical exam deferred        Extremities: Normal range of motion.  Edema: None  Mental Status: Normal mood and affect. Normal behavior. Normal judgment and thought content.   Assessment and Plan:  Pregnancy: G2P1001 at 1345w1d  1. Prenatal care in third trimester -Reviewed her antibodies; no  need for medication at this time -Patient has follow up for renal pyelactasis on 04-21-16 -Doing well mood-wise; has list of therapists if she feels she needs talk therapy  Preterm labor symptoms and general obstetric precautions including but not limited to vaginal bleeding, contractions, leaking of fluid and fetal movement were reviewed in detail with the patient. Please refer to After Visit Summary for other counseling recommendations.  Return in about 2 weeks (around 04/16/2016).   Valerie Haynes, CNM

## 2016-04-21 ENCOUNTER — Encounter (HOSPITAL_COMMUNITY): Payer: Self-pay

## 2016-04-21 ENCOUNTER — Encounter: Payer: Self-pay | Admitting: Obstetrics and Gynecology

## 2016-04-21 ENCOUNTER — Ambulatory Visit (HOSPITAL_COMMUNITY)
Admission: RE | Admit: 2016-04-21 | Discharge: 2016-04-21 | Disposition: A | Discharge status: Home / Self Care | Payer: BLUE CROSS/BLUE SHIELD | Source: Ambulatory Visit | Attending: Student | Admitting: Student

## 2016-04-21 ENCOUNTER — Ambulatory Visit (INDEPENDENT_AMBULATORY_CARE_PROVIDER_SITE_OTHER): Payer: BLUE CROSS/BLUE SHIELD | Admitting: Obstetrics and Gynecology

## 2016-04-21 VITALS — BP 106/58 | HR 78 | Temp 98.0°F | Wt 176.7 lb

## 2016-04-21 DIAGNOSIS — O35EXX0 Maternal care for other (suspected) fetal abnormality and damage, fetal genitourinary anomalies, not applicable or unspecified: Secondary | ICD-10-CM

## 2016-04-21 DIAGNOSIS — Z3483 Encounter for supervision of other normal pregnancy, third trimester: Secondary | ICD-10-CM

## 2016-04-21 DIAGNOSIS — O358XX0 Maternal care for other (suspected) fetal abnormality and damage, not applicable or unspecified: Secondary | ICD-10-CM | POA: Diagnosis not present

## 2016-04-21 DIAGNOSIS — E063 Autoimmune thyroiditis: Secondary | ICD-10-CM

## 2016-04-21 DIAGNOSIS — O283 Abnormal ultrasonic finding on antenatal screening of mother: Secondary | ICD-10-CM

## 2016-04-21 DIAGNOSIS — Z3A33 33 weeks gestation of pregnancy: Secondary | ICD-10-CM | POA: Diagnosis not present

## 2016-04-21 DIAGNOSIS — Z3493 Encounter for supervision of normal pregnancy, unspecified, third trimester: Secondary | ICD-10-CM

## 2016-04-21 NOTE — Patient Instructions (Signed)
Third Trimester of Pregnancy The third trimester is from week 28 through week 40 (months 7 through 9). The third trimester is a time when the unborn baby (fetus) is growing rapidly. At the end of the ninth month, the fetus is about 20 inches in length and weighs 6-10 pounds. Body changes during your third trimester Your body will continue to go through many changes during pregnancy. The changes vary from woman to woman. During the third trimester:  Your weight will continue to increase. You can expect to gain 25-35 pounds (11-16 kg) by the end of the pregnancy.  You may begin to get stretch marks on your hips, abdomen, and breasts.  You may urinate more often because the fetus is moving lower into your pelvis and pressing on your bladder.  You may develop or continue to have heartburn. This is caused by increased hormones that slow down muscles in the digestive tract.  You may develop or continue to have constipation because increased hormones slow digestion and cause the muscles that push waste through your intestines to relax.  You may develop hemorrhoids. These are swollen veins (varicose veins) in the rectum that can itch or be painful.  You may develop swollen, bulging veins (varicose veins) in your legs.  You may have increased body aches in the pelvis, back, or thighs. This is due to weight gain and increased hormones that are relaxing your joints.  You may have changes in your hair. These can include thickening of your hair, rapid growth, and changes in texture. Some women also have hair loss during or after pregnancy, or hair that feels dry or thin. Your hair will most likely return to normal after your baby is born.  Your breasts will continue to grow and they will continue to become tender. A yellow fluid (colostrum) may leak from your breasts. This is the first milk you are producing for your baby.  Your belly button may stick out.  You may notice more swelling in your hands,  face, or ankles.  You may have increased tingling or numbness in your hands, arms, and legs. The skin on your belly may also feel numb.  You may feel short of breath because of your expanding uterus.  You may have more problems sleeping. This can be caused by the size of your belly, increased need to urinate, and an increase in your body's metabolism.  You may notice the fetus "dropping," or moving lower in your abdomen (lightening).  You may have increased vaginal discharge.  You may notice your joints feel loose and you may have pain around your pelvic bone.  What to expect at prenatal visits You will have prenatal exams every 2 weeks until week 36. Then you will have weekly prenatal exams. During a routine prenatal visit:  You will be weighed to make sure you and the baby are growing normally.  Your blood pressure will be taken.  Your abdomen will be measured to track your baby's growth.  The fetal heartbeat will be listened to.  Any test results from the previous visit will be discussed.  You may have a cervical check near your due date to see if your cervix has softened or thinned (effaced).  You will be tested for Group B streptococcus. This happens between 35 and 37 weeks.  Your health care provider may ask you:  What your birth plan is.  How you are feeling.  If you are feeling the baby move.  If you have had   any abnormal symptoms, such as leaking fluid, bleeding, severe headaches, or abdominal cramping.  If you are using any tobacco products, including cigarettes, chewing tobacco, and electronic cigarettes.  If you have any questions.  Other tests or screenings that may be performed during your third trimester include:  Blood tests that check for low iron levels (anemia).  Fetal testing to check the health, activity level, and growth of the fetus. Testing is done if you have certain medical conditions or if there are problems during the  pregnancy.  Nonstress test (NST). This test checks the health of your baby to make sure there are no signs of problems, such as the baby not getting enough oxygen. During this test, a belt is placed around your belly. The baby is made to move, and its heart rate is monitored during movement.  What is false labor? False labor is a condition in which you feel small, irregular tightenings of the muscles in the womb (contractions) that usually go away with rest, changing position, or drinking water. These are called Braxton Hicks contractions. Contractions may last for hours, days, or even weeks before true labor sets in. If contractions come at regular intervals, become more frequent, increase in intensity, or become painful, you should see your health care provider. What are the signs of labor?  Abdominal cramps.  Regular contractions that start at 10 minutes apart and become stronger and more frequent with time.  Contractions that start on the top of the uterus and spread down to the lower abdomen and back.  Increased pelvic pressure and dull back pain.  A watery or bloody mucus discharge that comes from the vagina.  Leaking of amniotic fluid. This is also known as your "water breaking." It could be a slow trickle or a gush. Let your health care provider know if it has a color or strange odor. If you have any of these signs, call your health care provider right away, even if it is before your due date. Follow these instructions at home: Medicines  Follow your health care provider's instructions regarding medicine use. Specific medicines may be either safe or unsafe to take during pregnancy.  Take a prenatal vitamin that contains at least 600 micrograms (mcg) of folic acid.  If you develop constipation, try taking a stool softener if your health care provider approves. Eating and drinking  Eat a balanced diet that includes fresh fruits and vegetables, whole grains, good sources of protein  such as meat, eggs, or tofu, and low-fat dairy. Your health care provider will help you determine the amount of weight gain that is right for you.  Avoid raw meat and uncooked cheese. These carry germs that can cause birth defects in the baby.  If you have low calcium intake from food, talk to your health care provider about whether you should take a daily calcium supplement.  Eat four or five small meals rather than three large meals a day.  Limit foods that are high in fat and processed sugars, such as fried and sweet foods.  To prevent constipation: ? Drink enough fluid to keep your urine clear or pale yellow. ? Eat foods that are high in fiber, such as fresh fruits and vegetables, whole grains, and beans. Activity  Exercise only as directed by your health care provider. Most women can continue their usual exercise routine during pregnancy. Try to exercise for 30 minutes at least 5 days a week. Stop exercising if you experience uterine contractions.  Avoid heavy   lifting.  Do not exercise in extreme heat or humidity, or at high altitudes.  Wear low-heel, comfortable shoes.  Practice good posture.  You may continue to have sex unless your health care provider tells you otherwise. Relieving pain and discomfort  Take frequent breaks and rest with your legs elevated if you have leg cramps or low back pain.  Take warm sitz baths to soothe any pain or discomfort caused by hemorrhoids. Use hemorrhoid cream if your health care provider approves.  Wear a good support bra to prevent discomfort from breast tenderness.  If you develop varicose veins: ? Wear support pantyhose or compression stockings as told by your healthcare provider. ? Elevate your feet for 15 minutes, 3-4 times a day. Prenatal care  Write down your questions. Take them to your prenatal visits.  Keep all your prenatal visits as told by your health care provider. This is important. Safety  Wear your seat belt at  all times when driving.  Make a list of emergency phone numbers, including numbers for family, friends, the hospital, and police and fire departments. General instructions  Avoid cat litter boxes and soil used by cats. These carry germs that can cause birth defects in the baby. If you have a cat, ask someone to clean the litter box for you.  Do not travel far distances unless it is absolutely necessary and only with the approval of your health care provider.  Do not use hot tubs, steam rooms, or saunas.  Do not drink alcohol.  Do not use any products that contain nicotine or tobacco, such as cigarettes and e-cigarettes. If you need help quitting, ask your health care provider.  Do not use any medicinal herbs or unprescribed drugs. These chemicals affect the formation and growth of the baby.  Do not douche or use tampons or scented sanitary pads.  Do not cross your legs for long periods of time.  To prepare for the arrival of your baby: ? Take prenatal classes to understand, practice, and ask questions about labor and delivery. ? Make a trial run to the hospital. ? Visit the hospital and tour the maternity area. ? Arrange for maternity or paternity leave through employers. ? Arrange for family and friends to take care of pets while you are in the hospital. ? Purchase a rear-facing car seat and make sure you know how to install it in your car. ? Pack your hospital bag. ? Prepare the baby's nursery. Make sure to remove all pillows and stuffed animals from the baby's crib to prevent suffocation.  Visit your dentist if you have not gone during your pregnancy. Use a soft toothbrush to brush your teeth and be gentle when you floss. Contact a health care provider if:  You are unsure if you are in labor or if your water has broken.  You become dizzy.  You have mild pelvic cramps, pelvic pressure, or nagging pain in your abdominal area.  You have lower back pain.  You have persistent  nausea, vomiting, or diarrhea.  You have an unusual or bad smelling vaginal discharge.  You have pain when you urinate. Get help right away if:  Your water breaks before 37 weeks.  You have regular contractions less than 5 minutes apart before 37 weeks.  You have a fever.  You are leaking fluid from your vagina.  You have spotting or bleeding from your vagina.  You have severe abdominal pain or cramping.  You have rapid weight loss or weight gain.    You have shortness of breath with chest pain.  You notice sudden or extreme swelling of your face, hands, ankles, feet, or legs.  Your baby makes fewer than 10 movements in 2 hours.  You have severe headaches that do not go away when you take medicine.  You have vision changes. Summary  The third trimester is from week 28 through week 40, months 7 through 9. The third trimester is a time when the unborn baby (fetus) is growing rapidly.  During the third trimester, your discomfort may increase as you and your baby continue to gain weight. You may have abdominal, leg, and back pain, sleeping problems, and an increased need to urinate.  During the third trimester your breasts will keep growing and they will continue to become tender. A yellow fluid (colostrum) may leak from your breasts. This is the first milk you are producing for your baby.  False labor is a condition in which you feel small, irregular tightenings of the muscles in the womb (contractions) that eventually go away. These are called Braxton Hicks contractions. Contractions may last for hours, days, or even weeks before true labor sets in.  Signs of labor can include: abdominal cramps; regular contractions that start at 10 minutes apart and become stronger and more frequent with time; watery or bloody mucus discharge that comes from the vagina; increased pelvic pressure and dull back pain; and leaking of amniotic fluid. This information is not intended to replace advice  given to you by your health care provider. Make sure you discuss any questions you have with your health care provider. Document Released: 01/27/2001 Document Revised: 07/11/2015 Document Reviewed: 04/05/2012 Elsevier Interactive Patient Education  2017 [employer]  

## 2016-04-21 NOTE — Progress Notes (Signed)
   PRENATAL VISIT NOTE  Subjective:  Valerie Haynes is a 26 y.o. G2P1001 at 6526w6d being seen today for ongoing prenatal care.  She is currently monitored for the following issues for this low-risk pregnancy and has Supervision of normal pregnancy; Depression; Hashimoto's disease; Abnormal pregnancy US; and Prenatal care in third trimester on her problem list.  Patient reports no complaints.  Contractions: Not present. Vag. Bleeding: None.  Movement: Present. Denies leaking of fluid. Had one episode red streaked stool and hx hemorrhoids in prior pregnancy. Water birth certificate scanned.   The following portions of the patient's history were reviewed and updated as appropriate: allergies, current medications, past family history, past medical history, past social history, past surgical history and problem list. Problem list updated.  Objective:   Vitals:   04/21/16 0805  BP: (!) 106/58  Pulse: 78  Temp: 98 F (36.7 C)  Weight: 80.2 kg (176 lb 11.2 oz)    Fetal Status: Fetal Heart Rate (bpm): 140 Fundal Height: 33 cm Movement: Present  Presentation: Vertex  General:  Alert, oriented and cooperative. Patient is in no acute distress.  Skin: Skin is warm and dry. No rash noted.   Cardiovascular: Normal heart rate noted  Respiratory: Normal respiratory effort, no problems with respiration noted  Abdomen: Soft, gravid, appropriate for gestational age. Pain/Pressure: Present     Pelvic:  Cervical exam deferred        Extremities: Normal range of motion.  Edema: None  Mental Status: Normal mood and affect. Normal behavior. Normal judgment and thought content.   Assessment and Plan:  Pregnancy: G2P1001 at 6326w6d 1. Prenatal care in third trimester   2. Hashimoto's disease   3. Abnormal pregnancy US   4. Encounter for supervision of other normal pregnancy in third trimester    For US today to F/U fetal renal pyelectasis There are no diagnoses linked to this encounter. Preterm labor  symptoms and general obstetric precautions including but not limited to vaginal bleeding, contractions, leaking of fluid and fetal movement were reviewed in detail with the patient. Please refer to After Visit Summary for other counseling recommendations.  Follow up 2 weeks  Deirdre Colin Mulders Poe, CNM

## 2016-05-05 ENCOUNTER — Ambulatory Visit (INDEPENDENT_AMBULATORY_CARE_PROVIDER_SITE_OTHER): Payer: BLUE CROSS/BLUE SHIELD | Admitting: Certified Nurse Midwife

## 2016-05-05 ENCOUNTER — Other Ambulatory Visit (HOSPITAL_COMMUNITY)
Admission: RE | Admit: 2016-05-05 | Discharge: 2016-05-05 | Disposition: A | Discharge status: Home / Self Care | Payer: BLUE CROSS/BLUE SHIELD | Source: Ambulatory Visit | Attending: Certified Nurse Midwife | Admitting: Certified Nurse Midwife

## 2016-05-05 VITALS — BP 120/63 | HR 85 | Wt 181.0 lb

## 2016-05-05 DIAGNOSIS — Z3483 Encounter for supervision of other normal pregnancy, third trimester: Secondary | ICD-10-CM | POA: Insufficient documentation

## 2016-05-05 DIAGNOSIS — E063 Autoimmune thyroiditis: Secondary | ICD-10-CM

## 2016-05-05 DIAGNOSIS — O283 Abnormal ultrasonic finding on antenatal screening of mother: Secondary | ICD-10-CM

## 2016-05-05 LAB — OB RESULTS CONSOLE GBS: STREP GROUP B AG: POSITIVE

## 2016-05-05 NOTE — Progress Notes (Signed)
Subjective:  Valerie Haynes is a 26 y.o. G2P1001 at 10158w6d being seen today for ongoing prenatal care.  She is currently monitored for the following issues for this low-risk pregnancy and has Supervision of normal pregnancy; Depression; Hashimoto's disease; Abnormal pregnancy US; and Prenatal care in third trimester on her problem list.  Patient reports no complaints.  Contractions: Not present. Vag. Bleeding: None.  Movement: Present. Denies leaking of fluid.   The following portions of the patient's history were reviewed and updated as appropriate: allergies, current medications, past family history, past medical history, past social history, past surgical history and problem list. Problem list updated.  Objective:   Vitals:   05/05/16 0849  BP: 120/63  Pulse: 85  Weight: 181 lb (82.1 kg)    Fetal Status: Fetal Heart Rate (bpm): 137 Fundal Height: 35 cm Movement: Present  Presentation: Vertex  General:  Alert, oriented and cooperative. Patient is in no acute distress.  Skin: Skin is warm and dry. No rash noted.   Cardiovascular: Normal heart rate noted  Respiratory: Normal respiratory effort, no problems with respiration noted  Abdomen: Soft, gravid, appropriate for gestational age. Pain/Pressure: Present     Pelvic: Vag. Bleeding: None     Cervical exam deferred        Extremities: Normal range of motion.  Edema: None  Mental Status: Normal mood and affect. Normal behavior. Normal judgment and thought content.   Urinalysis:      Assessment and Plan:  Pregnancy: G2P1001 at 5158w6d  1. Encounter for supervision of other normal pregnancy in third trimester - GC[facility]Chlam[facility] probe amp [facility] - Culture, beta strep (group b only)  2. Hashimoto's disease -TSH next week  3. Abnormal pregnancy US - bilateral UTD-notify peds at delivery  Term labor symptoms and general obstetric precautions including but not limited to vaginal bleeding, contractions, leaking of fluid  and fetal movement were reviewed in detail with the patient. Please refer to After Visit Summary for other counseling recommendations.  Return in about 1 week (around 05/12/2016).   Donette LarryMelanie Tysean Vandervliet, CNM

## 2016-05-06 LAB — GC/CHLAMYDIA PROBE AMP (~~LOC~~) NOT AT ARMC
CHLAMYDIA, DNA PROBE: NEGATIVE
NEISSERIA GONORRHEA: NEGATIVE

## 2016-05-08 ENCOUNTER — Encounter: Payer: Self-pay | Admitting: Certified Nurse Midwife

## 2016-05-08 DIAGNOSIS — O99820 Streptococcus B carrier state complicating pregnancy: Secondary | ICD-10-CM | POA: Insufficient documentation

## 2016-05-08 LAB — CULTURE, BETA STREP (GROUP B ONLY): Strep Gp B Culture: POSITIVE — AB

## 2016-05-12 ENCOUNTER — Other Ambulatory Visit: Payer: BLUE CROSS/BLUE SHIELD | Admitting: Obstetrics & Gynecology

## 2016-05-12 ENCOUNTER — Ambulatory Visit (INDEPENDENT_AMBULATORY_CARE_PROVIDER_SITE_OTHER): Payer: BLUE CROSS/BLUE SHIELD | Admitting: Advanced Practice Midwife

## 2016-05-12 VITALS — BP 103/65 | HR 84 | Wt 181.7 lb

## 2016-05-12 DIAGNOSIS — E063 Autoimmune thyroiditis: Secondary | ICD-10-CM

## 2016-05-12 DIAGNOSIS — O368130 Decreased fetal movements, third trimester, not applicable or unspecified: Secondary | ICD-10-CM

## 2016-05-13 LAB — TSH: TSH: 0.981 u[IU]/mL (ref 0.450–4.500)

## 2016-05-13 NOTE — Progress Notes (Signed)
   PRENATAL VISIT NOTE  Subjective:  Valerie Haynes is a 26 y.o. G2P1001 at 7466w0d being seen today for ongoing prenatal care.  She is currently monitored for the following issues for this low-risk pregnancy and has Supervision of normal pregnancy; Depression; Hashimoto's disease; Abnormal pregnancy US; Prenatal care in third trimester; and GBS (group B Streptococcus carrier), +RV culture, currently pregnant on her problem list.  Patient reports decreased fetal movement.  Contractions: Not present. Vag. Bleeding: None.  Movement: Present. Denies leaking of fluid.   The following portions of the patient's history were reviewed and updated as appropriate: allergies, current medications, past family history, past medical history, past social history, past surgical history and problem list. Problem list updated.  Objective:   Vitals:   05/12/16 1115  BP: 103/65  Pulse: 84  Weight: 181 lb 11.2 oz (82.4 kg)    Fetal Status: Fetal Heart Rate (bpm): 135 Fundal Height: 36 cm Movement: Present     General:  Alert, oriented and cooperative. Patient is in no acute distress.  Skin: Skin is warm and dry. No rash noted.   Cardiovascular: Normal heart rate noted  Respiratory: Normal respiratory effort, no problems with respiration noted  Abdomen: Soft, gravid, appropriate for gestational age. Pain/Pressure: Present     Pelvic:  Cervical exam deferred        Extremities: Normal range of motion.  Edema: None  Mental Status: Normal mood and affect. Normal behavior. Normal judgment and thought content.   Assessment and Plan:  Pregnancy: G2P1001 at 2766w0d  1. Hashimoto's disease  - TSH  2. Decreased fetal movements in third trimester, single or unspecified fetus --Pt is feeling movement, but less than usual.  FHT wnl.  Pt needs to go home to arrange childcare for her toddler.  Pt to return to office this afternoon for NST.  Term labor symptoms and general obstetric precautions including but not  limited to vaginal bleeding, contractions, leaking of fluid and fetal movement were reviewed in detail with the patient. Please refer to After Visit Summary for other counseling recommendations.  No Follow-up on file.   Hurshel PartyLisa A Leftwich-Kirby, CNM

## 2016-05-18 ENCOUNTER — Telehealth: Payer: Self-pay | Admitting: *Deleted

## 2016-05-18 NOTE — Telephone Encounter (Signed)
Pt left message stating that she has office appt tomorrow. She wants to know if she can have a skin test for penicillin because she will need this antibiotic while in labor and has never had it before. I called pt back and heard a message stating that he mailbox was full and cannot accept messages at this time. This concern can be addressed at her appt tomorrow by the provider.

## 2016-05-19 ENCOUNTER — Ambulatory Visit (INDEPENDENT_AMBULATORY_CARE_PROVIDER_SITE_OTHER): Payer: BLUE CROSS/BLUE SHIELD | Admitting: Obstetrics and Gynecology

## 2016-05-19 VITALS — BP 100/72 | HR 89 | Wt 182.7 lb

## 2016-05-19 DIAGNOSIS — O99820 Streptococcus B carrier state complicating pregnancy: Secondary | ICD-10-CM

## 2016-05-19 DIAGNOSIS — E063 Autoimmune thyroiditis: Secondary | ICD-10-CM

## 2016-05-19 DIAGNOSIS — Z3403 Encounter for supervision of normal first pregnancy, third trimester: Secondary | ICD-10-CM

## 2016-05-19 NOTE — Progress Notes (Signed)
   PRENATAL VISIT NOTE  Subjective:  Valerie Haynes is a 26 y.o. G2P1001 at [redacted]w[redacted]d being seen today for ongoing prenatal care.  She is currently monitored for the following issues for this low-risk pregnancy and has Supervision of normal pregnancy; Depression; Hashimoto's disease; Abnormal pregnancy [place]; Prenatal care in third trimester; and GBS (group B Streptococcus carrier), +RV culture, currently pregnant on her problem list.  Patient reports no complaints.  Contractions: Not present. Vag. Bleeding: None.  Movement: Present. Denies leaking of fluid.   GBS +, patient has concerns about PCN. She has never had this before. 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   The following portions of the patient's history were reviewed and updated as appropriate: allergies, current medications, past family history, past medical history, past social history, past surgical history and problem list. Problem list updated.  Objective:   Vitals:   05/19/16 0832  BP: 100/72  Pulse: 89  Weight: 182 lb 11.2 oz (82.9 kg)    Fetal Status: Fetal Heart Rate (bpm): 130   Movement: Present     General:  Alert, oriented and cooperative. Patient is in no acute distress.  Skin: Skin is warm and dry. No rash noted.   Cardiovascular: Normal heart rate noted  Respiratory: Normal respiratory effort, no problems with respiration noted  Abdomen: Soft, gravid, appropriate for gestational age. Pain/Pressure: Present     Pelvic:  Cervical exam deferred        Extremities: Normal range of motion.  Edema: None  Mental Status: Normal mood and affect. Normal behavior. Normal judgment and thought content.   Assessment and Plan:  Pregnancy: G2P1001 at [redacted]w[redacted]d  1. GBS (group B Streptococcus carrier), +RV culture, currently pregnant  - discussed today. Reassurance given.  2. Encounter for supervision of normal first pregnancy in third trimester   3. Hashimoto's  disease  Last TSH WNL    There are no diagnoses linked to this encounter. Term labor symptoms and general obstetric precautions including but not limited to vaginal bleeding, contractions, leaking of fluid and fetal movement were reviewed in detail with the patient. Please refer to After Visit Summary for other counseling recommendations.  Return in about 1 week (around 05/26/2016).   Duane Lope, NP

## 2016-05-26 ENCOUNTER — Ambulatory Visit (INDEPENDENT_AMBULATORY_CARE_PROVIDER_SITE_OTHER): Payer: BLUE CROSS/BLUE SHIELD | Admitting: Advanced Practice Midwife

## 2016-05-26 VITALS — BP 112/72 | HR 87 | Wt 183.3 lb

## 2016-05-26 DIAGNOSIS — Z3483 Encounter for supervision of other normal pregnancy, third trimester: Secondary | ICD-10-CM

## 2016-05-26 DIAGNOSIS — O99820 Streptococcus B carrier state complicating pregnancy: Secondary | ICD-10-CM

## 2016-05-26 NOTE — Patient Instructions (Signed)

## 2016-05-26 NOTE — Progress Notes (Signed)
   PRENATAL VISIT NOTE  Subjective:  Valerie Haynes is Valerie Kulkarni2P1001 at [redacted]w[redacted]d being seen today for ongoing prenatal care.  She is currently monitored for the following issues for this low-risk pregnancy and has Supervision of normal pregnancy; Depression; Hashimoto's disease; Abnormal pregnancy [place]; Prenatal care in third trimester; and GBS (group B Streptococcus carrier), +RV culture, currently pregnant on her problem list.  Patient reports pelvic pressure.  Contractions: Irregular. Vag. Bleeding: None.  Movement: Present. Denies leaking of fluid.   The following portions of the patient's history were reviewed and updated as appropriate: allergies, current medications, past family history, past medical history, past social history, past surgical history and problem list. Problem list updated.  Objective:   Vitals:   05/26/16 0930  BP: 112/72  Pulse: 87  Weight: 183 lb 4.8 oz (83.1 kg)    Fetal Status: Fetal Heart Rate (bpm): 144 Fundal Height: 38 cm Movement: Present  Presentation: Vertex  General:  Alert, oriented and cooperative. Patient is in no acute distress.  Skin: Skin is warm and dry. No rash noted.   Cardiovascular: Normal heart rate noted  Respiratory: Normal respiratory effort, no problems with respiration noted  Abdomen: Soft, gravid, appropriate for gestational age. Pain/Pressure: Present     Pelvic:  Cervical exam performed Dilation: Closed Effacement (%): 60 Station: Ballotable  Extremities: Normal range of motion.  Edema: None  Mental Status: Normal mood and affect. Normal behavior. Normal judgment and thought content.   Assessment and Plan:  Pregnancy: G2P1001 at [redacted]w[redacted]d  Supervision of Nml pregnancy  Term labor symptoms and general obstetric precautions including but not limited to vaginal bleeding, contractions, leaking of fluid and fetal movement were reviewed in detail with the patient. Please refer to After Visit Summary for other counseling  recommendations.  F/U 1 week   [place], [place]

## 2016-06-03 ENCOUNTER — Ambulatory Visit (INDEPENDENT_AMBULATORY_CARE_PROVIDER_SITE_OTHER): Payer: BLUE CROSS/BLUE SHIELD | Admitting: Advanced Practice Midwife

## 2016-06-03 VITALS — BP 110/72 | HR 98 | Wt 184.4 lb

## 2016-06-03 DIAGNOSIS — O99820 Streptococcus B carrier state complicating pregnancy: Secondary | ICD-10-CM

## 2016-06-03 DIAGNOSIS — Z3483 Encounter for supervision of other normal pregnancy, third trimester: Secondary | ICD-10-CM

## 2016-06-03 NOTE — Progress Notes (Signed)
   PRENATAL VISIT NOTE  Subjective:  Valerie Haynes is a 26 y.o. G2P1001 at [redacted]w[redacted]d being seen today for ongoing prenatal care.  She is currently monitored for the following issues for this low-risk pregnancy and has Supervision of normal pregnancy; Depression; Hashimoto's disease; Abnormal pregnancy [place]; Prenatal care in third trimester; and GBS (group B Streptococcus carrier), +RV culture, currently pregnant on her problem list.  Patient reports occasional contractions.  Contractions: Not present. Vag. Bleeding: None.  Movement: Present. Denies leaking of fluid.   The following portions of the patient's history were reviewed and updated as appropriate: allergies, current medications, past family history, past medical history, past social history, past surgical history and problem list. Problem list updated.  Objective:   Vitals:   06/03/16 0935  BP: 110/72  Pulse: 98  Weight: 184 lb 6.4 oz (83.6 kg)    Fetal Status: Fetal Heart Rate (bpm): 138 Fundal Height: 40 cm Movement: Present  Presentation: Vertex  General:  Alert, oriented and cooperative. Patient is in no acute distress.  Skin: Skin is warm and dry. No rash noted.   Cardiovascular: Normal heart rate noted  Respiratory: Normal respiratory effort, no problems with respiration noted  Abdomen: Soft, gravid, appropriate for gestational age. Pain/Pressure: Present     Pelvic:  Cervical exam deferred        Extremities: Normal range of motion.  Edema: None  Mental Status: Normal mood and affect. Normal behavior. Normal judgment and thought content.   Assessment and Plan:  Pregnancy: G2P1001 at [redacted]w[redacted]d  1. Encounter for supervision of other normal pregnancy in third trimester   2. GBS (group B Streptococcus carrier), +RV culture, currently pregnant   Term labor symptoms and general obstetric precautions including but not limited to vaginal bleeding, contractions, leaking of fluid and fetal movement were reviewed in detail with the  patient. Please refer to After Visit Summary for other counseling recommendations.  Return in about 2 days (around 06/05/2016) for NST/AFI.   Dorathy Kinsman, CNM

## 2016-06-03 NOTE — Patient Instructions (Signed)
Braxton Hicks Contractions °Contractions of the uterus can occur throughout pregnancy, but they are not always a sign that you are in labor. You may have practice contractions called Braxton Hicks contractions. These false labor contractions are sometimes confused with true labor. °What are Braxton Hicks contractions? °Braxton Hicks contractions are tightening movements that occur in the muscles of the uterus before labor. Unlike true labor contractions, these contractions do not result in opening (dilation) and thinning of the cervix. Toward the end of pregnancy (32-34 weeks), Braxton Hicks contractions can happen more often and may become stronger. These contractions are sometimes difficult to tell apart from true labor because they can be very uncomfortable. You should not feel embarrassed if you go to the hospital with false labor. °Sometimes, the only way to tell if you are in true labor is for your health care provider to look for changes in the cervix. The health care provider will do a physical exam and may monitor your contractions. If you are not in true labor, the exam should show that your cervix is not dilating and your water has not broken. °If there are no prenatal problems or other health problems associated with your pregnancy, it is completely safe for you to be sent home with false labor. You may continue to have Braxton Hicks contractions until you go into true labor. °How can I tell the difference between true labor and false labor? °· Differences °¨ False labor °¨ Contractions last 30-70 seconds.: Contractions are usually shorter and not as strong as true labor contractions. °¨ Contractions become very regular.: Contractions are usually irregular. °¨ Discomfort is usually felt in the top of the uterus, and it spreads to the lower abdomen and low back.: Contractions are often felt in the front of the lower abdomen and in the groin. °¨ Contractions do not go away with walking.: Contractions may  go away when you walk around or change positions while lying down. °¨ Contractions usually become more intense and increase in frequency.: Contractions get weaker and are shorter-lasting as time goes on. °¨ The cervix dilates and gets thinner.: The cervix usually does not dilate or become thin. °Follow these instructions at home: °¨ Take over-the-counter and prescription medicines only as told by your health care provider. °¨ Keep up with your usual exercises and follow other instructions from your health care provider. °¨ Eat and drink lightly if you think you are going into labor. °¨ If Braxton Hicks contractions are making you uncomfortable: °¨ Change your position from lying down or resting to walking, or change from walking to resting. °¨ Sit and rest in a tub of warm water. °¨ Drink enough fluid to keep your urine clear or pale yellow. Dehydration may cause these contractions. °¨ Do slow and deep breathing several times an hour. °¨ Keep all follow-up prenatal visits as told by your health care provider. This is important. °Contact a health care provider if: °¨ You have a fever. °¨ You have continuous pain in your abdomen. °Get help right away if: °¨ Your contractions become stronger, more regular, and closer together. °¨ You have fluid leaking or gushing from your vagina. °¨ You pass blood-tinged mucus (bloody show). °¨ You have bleeding from your vagina. °¨ You have low back pain that you never had before. °¨ You feel your baby’s head pushing down and causing pelvic pressure. °¨ Your baby is not moving inside you as much as it used to. °Summary °¨ Contractions that occur before labor are   called Braxton Hicks contractions, false labor, or practice contractions. °¨ Braxton Hicks contractions are usually shorter, weaker, farther apart, and less regular than true labor contractions. True labor contractions usually become progressively stronger and regular and they become more frequent. °¨ Manage discomfort from  Braxton Hicks contractions by changing position, resting in a warm bath, drinking plenty of water, or practicing deep breathing. °This information is not intended to replace advice given to you by your health care provider. Make sure you discuss any questions you have with your health care provider. °Document Released: 02/02/2005 Document Revised: 12/23/2015 Document Reviewed: 12/23/2015 °Elsevier Interactive Patient Education © 2017 [employer] °Fetal Movement Counts °Patient Name: ________________________________________________ Patient Due Date: ____________________ °What is a fetal movement count? °A fetal movement count is the number of times that you feel your baby move during a certain amount of time. This may also be called a fetal kick count. A fetal movement count is recommended for every pregnant woman. You may be asked to start counting fetal movements as early as week 28 of your pregnancy. °Pay attention to when your baby is most active. You may notice your baby's sleep and wake cycles. You may also notice things that make your baby move more. You should do a fetal movement count: °· When your baby is normally most active. °· At the same time each day. °A good time to count movements is while you are resting, after having something to eat and drink. °How do I count fetal movements? °1. Find a quiet, comfortable area. Sit, or lie down on your side. °2. Write down the date, the start time and stop time, and the number of movements that you felt between those two times. Take this information with you to your health care visits. °3. For 2 hours, count kicks, flutters, swishes, rolls, and jabs. You should feel at least 10 movements during 2 hours. °4. You may stop counting after you have felt 10 movements. °5. If you do not feel 10 movements in 2 hours, have something to eat and drink. Then, keep resting and counting for 1 hour. If you feel at least 4 movements during that hour, you may stop  counting. °Contact a health care provider if: °· You feel fewer than 4 movements in 2 hours. °· Your baby is not moving like he or she usually does. °Date: ____________ Start time: ____________ Stop time: ____________ Movements: ____________ °Date: ____________ Start time: ____________ Stop time: ____________ Movements: ____________ °Date: ____________ Start time: ____________ Stop time: ____________ Movements: ____________ °Date: ____________ Start time: ____________ Stop time: ____________ Movements: ____________ °Date: ____________ Start time: ____________ Stop time: ____________ Movements: ____________ °Date: ____________ Start time: ____________ Stop time: ____________ Movements: ____________ °Date: ____________ Start time: ____________ Stop time: ____________ Movements: ____________ °Date: ____________ Start time: ____________ Stop time: ____________ Movements: ____________ °Date: ____________ Start time: ____________ Stop time: ____________ Movements: ____________ °This information is not intended to replace advice given to you by your health care provider. Make sure you discuss any questions you have with your health care provider. °Document Released: 03/04/2006 Document Revised: 10/02/2015 Document Reviewed: 03/14/2015 °Elsevier Interactive Patient Education © 2017 [employer] ° °

## 2016-06-05 ENCOUNTER — Ambulatory Visit: Payer: Self-pay

## 2016-06-05 ENCOUNTER — Ambulatory Visit (INDEPENDENT_AMBULATORY_CARE_PROVIDER_SITE_OTHER): Payer: BLUE CROSS/BLUE SHIELD | Admitting: Obstetrics and Gynecology

## 2016-06-05 ENCOUNTER — Telehealth (HOSPITAL_COMMUNITY): Payer: Self-pay | Admitting: *Deleted

## 2016-06-05 ENCOUNTER — Encounter (HOSPITAL_COMMUNITY): Payer: Self-pay | Admitting: *Deleted

## 2016-06-05 VITALS — BP 109/64 | HR 79

## 2016-06-05 DIAGNOSIS — O480 Post-term pregnancy: Secondary | ICD-10-CM

## 2016-06-05 NOTE — Progress Notes (Signed)
NST reviewed and reactive.  

## 2016-06-05 NOTE — Progress Notes (Signed)
Pt informed that the ultrasound is considered a limited OB ultrasound and is not intended to be a complete ultrasound exam.  Patient also informed that the ultrasound is not being completed with the intent of assessing for fetal or placental anomalies or any pelvic abnormalities.  Explained that the purpose of today's ultrasound is to assess for presentation and amniotic fluid volume.  Patient acknowledges the purpose of the exam and the limitations of the study.    Pt declines to have membranes sweeped today - agrees to IOL @ 41 wks - scheduled on 4/25 @ 0700.  Labor precautions given.

## 2016-06-05 NOTE — Telephone Encounter (Signed)
Preadmission screen  

## 2016-06-07 ENCOUNTER — Inpatient Hospital Stay (HOSPITAL_COMMUNITY)
Admission: AD | Admit: 2016-06-07 | Discharge: 2016-06-07 | Disposition: A | Discharge status: Home / Self Care | Payer: BLUE CROSS/BLUE SHIELD | Source: Ambulatory Visit | Attending: Obstetrics & Gynecology | Admitting: Obstetrics & Gynecology

## 2016-06-07 ENCOUNTER — Encounter (HOSPITAL_COMMUNITY): Payer: Self-pay | Admitting: *Deleted

## 2016-06-07 ENCOUNTER — Inpatient Hospital Stay (HOSPITAL_COMMUNITY)
Admission: AD | Admit: 2016-06-07 | Discharge: 2016-06-09 | DRG: 775 | Disposition: A | Discharge status: Home / Self Care | Payer: BLUE CROSS/BLUE SHIELD | Source: Ambulatory Visit | Attending: Obstetrics & Gynecology | Admitting: Obstetrics & Gynecology

## 2016-06-07 DIAGNOSIS — O283 Abnormal ultrasonic finding on antenatal screening of mother: Secondary | ICD-10-CM

## 2016-06-07 DIAGNOSIS — Z3A00 Weeks of gestation of pregnancy not specified: Secondary | ICD-10-CM

## 2016-06-07 DIAGNOSIS — O99824 Streptococcus B carrier state complicating childbirth: Secondary | ICD-10-CM | POA: Diagnosis present

## 2016-06-07 DIAGNOSIS — Z3490 Encounter for supervision of normal pregnancy, unspecified, unspecified trimester: Secondary | ICD-10-CM | POA: Insufficient documentation

## 2016-06-07 DIAGNOSIS — Z87891 Personal history of nicotine dependence: Secondary | ICD-10-CM

## 2016-06-07 DIAGNOSIS — Z3A40 40 weeks gestation of pregnancy: Secondary | ICD-10-CM

## 2016-06-07 DIAGNOSIS — O99820 Streptococcus B carrier state complicating pregnancy: Secondary | ICD-10-CM

## 2016-06-07 DIAGNOSIS — Z3493 Encounter for supervision of normal pregnancy, unspecified, third trimester: Secondary | ICD-10-CM | POA: Diagnosis present

## 2016-06-07 DIAGNOSIS — O471 False labor at or after 37 completed weeks of gestation: Secondary | ICD-10-CM

## 2016-06-07 LAB — TYPE AND SCREEN
ABO/RH(D): O POS
ANTIBODY SCREEN: NEGATIVE

## 2016-06-07 LAB — CBC
HCT: 36.5 % (ref 36.0–46.0)
Hemoglobin: 12.3 g/dL (ref 12.0–15.0)
MCH: 30.2 pg (ref 26.0–34.0)
MCHC: 33.7 g/dL (ref 30.0–36.0)
MCV: 89.7 fL (ref 78.0–100.0)
PLATELETS: 137 10*3/uL — AB (ref 150–400)
RBC: 4.07 MIL/uL (ref 3.87–5.11)
RDW: 13.8 % (ref 11.5–15.5)
WBC: 8.5 10*3/uL (ref 4.0–10.5)

## 2016-06-07 LAB — ABO/RH: ABO/RH(D): O POS

## 2016-06-07 MED ORDER — ONDANSETRON HCL 4 MG/2ML IJ SOLN: 4.0000 mg | INTRAMUSCULAR | Status: DC | PRN

## 2016-06-07 MED ORDER — OXYCODONE-ACETAMINOPHEN 5-325 MG PO TABS: 1.0000 | ORAL_TABLET | ORAL | Status: DC | PRN

## 2016-06-07 MED ORDER — LACTATED RINGERS IV SOLN: INTRAVENOUS | Status: DC

## 2016-06-07 MED ORDER — MISOPROSTOL 200 MCG PO TABS
ORAL_TABLET | ORAL | Status: AC
  Administered 2016-06-07: 400 ug via BUCCAL
  Filled 2016-06-07: qty 2

## 2016-06-07 MED ORDER — WITCH HAZEL-GLYCERIN EX PADS: 1.0000 "application " | MEDICATED_PAD | CUTANEOUS | Status: DC | PRN

## 2016-06-07 MED ORDER — SOD CITRATE-CITRIC ACID 500-334 MG/5ML PO SOLN: 30.0000 mL | ORAL | Status: DC | PRN

## 2016-06-07 MED ORDER — ONDANSETRON HCL 4 MG/2ML IJ SOLN: 4.0000 mg | Freq: Four times a day (QID) | INTRAMUSCULAR | Status: DC | PRN

## 2016-06-07 MED ORDER — SIMETHICONE 80 MG PO CHEW: 80.0000 mg | CHEWABLE_TABLET | ORAL | Status: DC | PRN

## 2016-06-07 MED ORDER — COCONUT OIL OIL: 1.0000 "application " | TOPICAL_OIL | Status: DC | PRN

## 2016-06-07 MED ORDER — OXYCODONE HCL 5 MG PO TABS: 5.0000 mg | ORAL_TABLET | ORAL | Status: DC | PRN

## 2016-06-07 MED ORDER — OXYCODONE HCL 5 MG PO TABS: 10.0000 mg | ORAL_TABLET | ORAL | Status: DC | PRN

## 2016-06-07 MED ORDER — METHYLERGONOVINE MALEATE 0.2 MG/ML IJ SOLN
0.2000 mg | Freq: Once | INTRAMUSCULAR | Status: AC
  Administered 2016-06-07: 0.2 mg via INTRAMUSCULAR

## 2016-06-07 MED ORDER — OXYCODONE-ACETAMINOPHEN 5-325 MG PO TABS: 2.0000 | ORAL_TABLET | ORAL | Status: DC | PRN

## 2016-06-07 MED ORDER — TETANUS-DIPHTH-ACELL PERTUSSIS 5-2.5-18.5 LF-MCG/0.5 IM SUSP
0.5000 mL | Freq: Once | INTRAMUSCULAR | Status: DC
  Filled 2016-06-07: qty 0.5

## 2016-06-07 MED ORDER — LIDOCAINE HCL (PF) 1 % IJ SOLN
30.0000 mL | INTRAMUSCULAR | Status: AC | PRN
  Administered 2016-06-07: 30 mL via SUBCUTANEOUS
  Filled 2016-06-07 (×2): qty 30

## 2016-06-07 MED ORDER — SODIUM CHLORIDE 0.9 % IV SOLN
2.0000 g | Freq: Once | INTRAVENOUS | Status: AC
  Administered 2016-06-07: 2 g via INTRAVENOUS
  Filled 2016-06-07: qty 2000

## 2016-06-07 MED ORDER — DIPHENHYDRAMINE HCL 25 MG PO CAPS
25.0000 mg | ORAL_CAPSULE | Freq: Four times a day (QID) | ORAL | Status: DC | PRN
Start: 2016-06-07 — End: 2016-06-10

## 2016-06-07 MED ORDER — BENZOCAINE-MENTHOL 20-0.5 % EX AERO
1.0000 "application " | INHALATION_SPRAY | CUTANEOUS | Status: DC | PRN
  Administered 2016-06-08: 1 via TOPICAL
  Filled 2016-06-07: qty 56

## 2016-06-07 MED ORDER — OXYTOCIN BOLUS FROM INFUSION
500.0000 mL | Freq: Once | INTRAVENOUS | Status: AC
  Administered 2016-06-07: 500 mL via INTRAVENOUS

## 2016-06-07 MED ORDER — PRENATAL MULTIVITAMIN CH
1.0000 | ORAL_TABLET | Freq: Every day | ORAL | Status: DC
  Administered 2016-06-08: 1 via ORAL
  Filled 2016-06-07 (×2): qty 1

## 2016-06-07 MED ORDER — DIBUCAINE 1 % RE OINT: 1.0000 "application " | TOPICAL_OINTMENT | RECTAL | Status: DC | PRN

## 2016-06-07 MED ORDER — LACTATED RINGERS IV SOLN: 500.0000 mL | INTRAVENOUS | Status: DC | PRN

## 2016-06-07 MED ORDER — OXYTOCIN 40 UNITS IN LACTATED RINGERS INFUSION - SIMPLE MED
2.5000 [IU]/h | INTRAVENOUS | Status: DC
  Filled 2016-06-07: qty 1000

## 2016-06-07 MED ORDER — LIDOCAINE HCL (PF) 1 % IJ SOLN
30.0000 mL | Freq: Once | INTRAMUSCULAR | Status: AC
  Administered 2016-06-07: 30 mL

## 2016-06-07 MED ORDER — MISOPROSTOL 200 MCG PO TABS
400.0000 ug | ORAL_TABLET | Freq: Once | ORAL | Status: AC
  Administered 2016-06-07: 400 ug via BUCCAL

## 2016-06-07 MED ORDER — METHYLERGONOVINE MALEATE 0.2 MG PO TABS: 0.2000 mg | ORAL_TABLET | ORAL | Status: DC | PRN

## 2016-06-07 MED ORDER — SENNOSIDES-DOCUSATE SODIUM 8.6-50 MG PO TABS
2.0000 | ORAL_TABLET | ORAL | Status: DC
  Administered 2016-06-07: 2 via ORAL
  Filled 2016-06-07: qty 2

## 2016-06-07 MED ORDER — METHYLERGONOVINE MALEATE 0.2 MG/ML IJ SOLN: 0.2000 mg | INTRAMUSCULAR | Status: DC | PRN

## 2016-06-07 MED ORDER — ACETAMINOPHEN 325 MG PO TABS: 650.0000 mg | ORAL_TABLET | ORAL | Status: DC | PRN

## 2016-06-07 MED ORDER — ONDANSETRON HCL 4 MG PO TABS: 4.0000 mg | ORAL_TABLET | ORAL | Status: DC | PRN

## 2016-06-07 MED ORDER — METHYLERGONOVINE MALEATE 0.2 MG/ML IJ SOLN
INTRAMUSCULAR | Status: AC
  Administered 2016-06-07: 0.2 mg via INTRAMUSCULAR
  Filled 2016-06-07: qty 1

## 2016-06-07 MED ORDER — FLEET ENEMA 7-19 GM/118ML RE ENEM: 1.0000 | ENEMA | RECTAL | Status: DC | PRN

## 2016-06-07 MED ORDER — MEASLES, MUMPS & RUBELLA VAC ~~LOC~~ INJ
0.5000 mL | INJECTION | Freq: Once | SUBCUTANEOUS | Status: DC
  Filled 2016-06-07: qty 0.5

## 2016-06-07 MED ORDER — ZOLPIDEM TARTRATE 5 MG PO TABS: 5.0000 mg | ORAL_TABLET | Freq: Every evening | ORAL | Status: DC | PRN

## 2016-06-07 MED ORDER — IBUPROFEN 600 MG PO TABS
600.0000 mg | ORAL_TABLET | Freq: Four times a day (QID) | ORAL | Status: DC
  Administered 2016-06-07 – 2016-06-09 (×8): 600 mg via ORAL
  Filled 2016-06-07 (×8): qty 1

## 2016-06-07 NOTE — MAU Note (Signed)
Pt reports contractions since this am, denies bleeding or ROM 

## 2016-06-07 NOTE — Progress Notes (Signed)
Discharge instructions given with pt understanding. Pt left unit via ambulatory 

## 2016-06-07 NOTE — MAU Note (Signed)
Pt was here earlier for contractions, they have gotten closer and stronger.  Denies LOF/VB.

## 2016-06-07 NOTE — H&P (Signed)
Valerie Haynes is a 26 y.o. female presenting for spontaneous onset of labor.  OB History    Gravida Para Term Preterm AB Living   SAB TAB Ectopic Multiple Live Births           1     Past Medical History:  Diagnosis Date  . Chronic Epstein Barr virus (EBV) infection   . Depression   . Hashimoto's thyroiditis   . IBS (irritable bowel syndrome)    Past Surgical History:  Procedure Laterality Date  . NO PAST SURGERIES    . WISDOM TOOTH EXTRACTION     Family History: family history includes Rheum arthritis in her father. Social History:  reports that she quit smoking about 5 years ago. Her smoking use included Cigarettes. She has quit using smokeless tobacco. She reports that she drinks alcohol. She reports that she uses drugs, including Marijuana.     Maternal Diabetes: No Genetic Screening: Normal Maternal Ultrasounds/Referrals: Normal Fetal Ultrasounds or other Referrals: None Maternal Substance Abuse:  No Significant Maternal Medications:  None Significant Maternal Lab Results:  None Other Comments:  None  Review of Systems  Constitutional: Negative.   HENT: Negative.   Eyes: Negative.   Respiratory: Negative.   Cardiovascular: Negative.   Gastrointestinal: Positive for abdominal pain.  Genitourinary: Negative.   Musculoskeletal: Negative.   Skin: Negative.   Neurological: Negative.   Endo/Heme/Allergies: Negative.   Psychiatric/Behavioral: Negative.    Maternal Medical History:  Reason for admission: Contractions.   Contractions: Onset was 6-12 hours ago.   Frequency: regular.   Perceived severity is moderate.    Fetal activity: Perceived fetal activity is normal.   Last perceived fetal movement was within the past hour.    Prenatal complications: no prenatal complications Prenatal Complications - Diabetes: none.    Dilation: 6 Effacement (%): 100 Station: -1 Exam by:: A. Gagliardo, RN Blood pressure 121/65, pulse 90, temperature 98  F (36.7 C), temperature source Oral, resp. rate 16, height  (1.702 m), weight 183 lb (83 kg), last menstrual period 08/18/2015. Maternal Exam:  Uterine Assessment: Contraction strength is moderate.  Contraction duration is 60 seconds. Contraction frequency is regular.   Abdomen: Patient reports no abdominal tenderness. Estimated fetal weight is 7lbs.   Fetal presentation: vertex  Introitus: Normal vulva. Normal vagina.  Ferning test: not done.  Nitrazine test: not done. Amniotic fluid character: not assessed.  Pelvis: adequate for delivery.   Cervix: Cervix evaluated by digital exam.     Fetal Exam Fetal Monitor Review: Mode: ultrasound.   Baseline rate: 140s.  Variability: moderate (6-25 bpm).   Pattern: no decelerations and accelerations present.    Fetal State Assessment: Category I - tracings are normal.     Physical Exam  Constitutional: She is oriented to person, place, and time. She appears well-developed and well-nourished.  HENT:  Head: Normocephalic.  Eyes: Pupils are equal, round, and reactive to light.  Neck: Normal range of motion.  Cardiovascular: Normal rate, regular rhythm and normal heart sounds.   Respiratory: Effort normal and breath sounds normal.  GI: Soft.  Genitourinary: Vagina normal.  Musculoskeletal: Normal range of motion.  Neurological: She is alert and oriented to person, place, and time.  Skin: Skin is warm and dry.  Psychiatric: She has a normal mood and affect. Her behavior is normal. Judgment and thought content normal.    Prenatal labs: ABO, Rh: O/Positive/-- (09/21 0000) Antibody: Negative (09/21 0000) Rubella:  Immune (09/21 0000) RPR: NON REAC (01/02 0948)  HBsAg: Negative (09/21 0000)  HIV: NONREACTIVE (01/02 0948)  GBS: Positive (03/20 0000)   Assessment/Plan: Valerie Haynes is a 26 y.o. female presenting for spontaneous onset of labor. GBS pos. Waterbirth  Plan: Manage expectantly. GBS prophylaxis.   Cleone Slim 06/07/2016, 5:23 PM

## 2016-06-08 LAB — RPR: RPR: NONREACTIVE

## 2016-06-08 NOTE — Lactation Note (Signed)
This note was copied from a baby's chart. Lactation Consultation Note Experienced BF mom has a 28 yr old that she BF for 13 months. Mom stated she had some clogged ducts and mastitis a couple of times. Discussed emptying breast. Mom stated she felt like breast was emptying well. Discussed massaging breast all over. Mom has pendulum breast w/short shaft everted nipples, very compressible. Baby popping off and on breast frequently. Mom attempting to BF in cradle position. Encouraged football hold. Taught sand which hold, "C" hold, and t-cup hold on breast for latching. Occasionally heard clicking. Unlatched, encouraged wide open flange. Baby sinking into breast, repositioned several times. Encouraged positioners under hand for support. Mom stated very comfortable. Mom encouraged to feed baby 8-12 times/24 hours and with feeding cues. Reviewed newborn feeding habits and behavior.  Encouraged mom to call for assistance or questions if needs assistance.  [facility] brochure given w/resources, support groups and LC services.  Patient Name: Valerie Haynes Today's Date: 06/08/2016 Reason for consult: Initial assessment   Maternal Data Has patient been taught Hand Expression?: Yes Does the patient have breastfeeding experience prior to this delivery?: Yes  Feeding Feeding Type: Breast Fed Length of feed: 10 min (still BF)  LATCH Score/Interventions Latch: Repeated attempts needed to sustain latch, nipple held in mouth throughout feeding, stimulation needed to elicit sucking reflex. Intervention(s): Breast massage  Audible Swallowing: Spontaneous and intermittent  Type of Nipple: Everted at rest and after stimulation  Comfort (Breast/Nipple): Soft / non-tender     Hold (Positioning): Assistance needed to correctly position infant at breast and maintain latch. Intervention(s): Breastfeeding basics reviewed;Support Pillows;Position options;Skin to skin  LATCH Score: 8  Lactation Tools  Discussed/Used     Consult Status Consult Status: Follow-up Date: 06/08/16 (in pm) Follow-up type: In-patient    Charyl Dancer 06/08/2016, 2:32 AM

## 2016-06-08 NOTE — Progress Notes (Signed)
Post Partum Day 1 Subjective: no complaints, up ad lib, voiding and tolerating PO  Objective: Blood pressure 121/67, pulse 67, temperature 98.3 F (36.8 C), temperature source Oral, resp. rate 18, height  (1.702 m), weight 183 lb (83 kg), last menstrual period 08/18/2015, unknown if currently breastfeeding.  Physical Exam:  General: alert, cooperative, appears stated age and no distress Lochia: appropriate Uterine Fundus: firm Incision: no dehiscence, no significant erythema DVT Evaluation: No evidence of DVT seen on physical exam.   Recent Labs  06/07/16 1714  HGB 12.3  HCT 36.5    Assessment/Plan: Plan for discharge tomorrow   LOS: 1 day   Wyvonnia Dusky 06/08/2016, 5:03 AM

## 2016-06-08 NOTE — Lactation Note (Signed)
This note was copied from a baby's chart. Lactation Consultation Note  Patient Name: Valerie Haynes [id number] Date: 06/08/2016 Reason for consult: Follow-up assessment   Follow-up consult at 25 hrs old; GA 40.4; BW 8 lbs, 5.9 oz. Infant has breastfed x10 (10-40 min) since birth; voids-6; stools-8; LS-10 by RN.   Mom had just latched infant to left breast cradle hold when LC entered room.  Mom stated she sometimes hears clicking and it hurts; she wanted tips for correcting it. Mom had infant in cradle hold left breast, shallow.   LC adjusted baby's position while on breast, brought baby's bottom around mom's body, and switched mom's hold to cross-cradle.  With this minimal re-positioning, mom immediately stated how much better it felt. Stated her shoulders were beginning to hurt. Mom was amazed at how much better the latch felt with support with positioning.   Basic breastfeeding teaching done; taught how to sandwich and use asymmetrical latching; taught chin tug and getting a mouth-full when infant is clicking. When infant came off breast, mom was able to independently re-latch with only minimal verbal cues from [facility]. Reviewed continuing to feed with cues, and cluster feeding.  Encouraged continuing to exclusively breastfeed.   Mom reported of over-supply with first child and issues with mastitis.  Stated she gave milk away with first baby.  [employer] Brochure given to mom if she has oversupply with this experience.   Mom has DEBP from first child; encouraged to call insurance company to inquire about pump benefits and brands of pumps offered.   Encouraged mom to call for assistance as needed.   Infant was still breastfeeding when [facility] left room.    Feeding Feeding Type: Breast Fed Length of feed: 15 min  LATCH Score/Interventions Latch: Grasps breast easily, tongue down, lips flanged, rhythmical sucking.  Audible Swallowing: Spontaneous and intermittent  Type of Nipple: Everted at  rest and after stimulation  Comfort (Breast/Nipple): Soft / non-tender     Hold (Positioning): Assistance needed to correctly position infant at breast and maintain latch. (adjusted mom's hold to cross-cradle hold) Intervention(s): Breastfeeding basics reviewed;Support Pillows;Skin to skin  LATCH Score: 9  Lactation Tools Discussed/Used WIC Program: No   Consult Status Consult Status: Follow-up Date: 06/09/16 Follow-up type: In-patient    Lendon Ka 06/08/2016, 8:54 PM

## 2016-06-08 NOTE — Progress Notes (Signed)
MOB was referred for history of depression/anxiety. * Referral screened out by Clinical Social Worker because none of the following criteria appear to apply: ~ History of anxiety/depression during this pregnancy, or of post-partum depression. ~ Diagnosis of anxiety and/or depression within last 3 years OR * MOB's symptoms currently being treated with medication and/or therapy.  CSW provided MOB with a local DBT therapist contact information ([facility], Jennifer Cobb).  Please contact the Clinical Social Worker if needs arise, or if MOB requests.  Jillian Pianka Boyd-Gilyard, MSW, LCSW Clinical Social Work [phone]   

## 2016-06-09 MED ORDER — IBUPROFEN 600 MG PO TABS: 600.0000 mg | ORAL_TABLET | Freq: Four times a day (QID) | ORAL | 0 refills | Status: DC

## 2016-06-09 MED ORDER — DOCUSATE SODIUM 100 MG PO CAPS
100.0000 mg | ORAL_CAPSULE | Freq: Two times a day (BID) | ORAL | 0 refills | Status: DC
Start: 2016-06-09 — End: 2021-11-12

## 2016-06-09 NOTE — Discharge Summary (Signed)
OB Discharge Summary     Patient Name: Valerie Haynes DOB: Mar 21, 1990 MRN: [id number]  Date of admission: 06/07/2016 Delivering MD: Rolm Bookbinder   Date of discharge: 06/09/2016  Admitting diagnosis: 40.4 WKS, CTXS Intrauterine pregnancy: [redacted]w[redacted]d     Secondary diagnosis:  Principal Problem:   SVD (spontaneous vaginal delivery) Active Problems:   Normal labor  Additional problems: None     Discharge diagnosis: Term Pregnancy Delivered                                                                                                Post partum procedures:None  Augmentation: None  Complications: None  Hospital course:  Onset of Labor With Vaginal Delivery     26 y.o. yo [id number] at [redacted]w[redacted]d was admitted in Active Labor on 06/07/2016. Patient had an uncomplicated labor course as follows:  Membrane Rupture Time/Date: 6:56 PM ,06/07/2016   Intrapartum Procedures: Episiotomy: None [1]                                         Lacerations:  2nd degree [3]  Patient had a water birth delivery of a Viable infant. 06/07/2016  Information for the patient's newborn:  Lynda, Wanninger [[id number]]  Delivery Method: Vaginal, Spontaneous Delivery (Filed from Delivery Summary)    Pateint had an uncomplicated postpartum course.  She is ambulating, tolerating a regular diet, passing flatus, and urinating well. Patient is discharged home in stable condition on 06/09/16.   Physical exam  Vitals:   06/07/16 2250 06/08/16 0245 06/08/16 1028 06/08/16 1736  BP: 118/67 121/67 108/62 (!) 101/57  Pulse: 82 67 79 66  Resp: Temp: 98.9 F (37.2 C) 98.3 F (36.8 C) 97.7 F (36.5 C) 97.6 F (36.4 C)  TempSrc: Oral Oral Oral Oral  Weight:      Height:       General: alert, cooperative and no distress Lochia: appropriate Uterine Fundus: firm Incision: N/A DVT Evaluation: No evidence of DVT seen on physical exam. Negative Homan's sign. No cords or calf tenderness. Labs: Lab Results   Component Value Date   WBC 8.5 06/07/2016   HGB 12.3 06/07/2016   HCT 36.5 06/07/2016   MCV 89.7 06/07/2016   PLT 137 (L) 06/07/2016   No flowsheet data found.  Discharge instruction: per After Visit Summary and "Baby and Me Booklet".  After visit meds:  Allergies as of 06/09/2016      Reactions   Milk-related Compounds Anaphylaxis   Peanut-containing Drug Products Anaphylaxis      Medication List    STOP taking these medications   diphenhydrAMINE 25 mg capsule Commonly known as:  BENADRYL     TAKE these medications   acidophilus Caps capsule Take 1 capsule by mouth daily.   docusate sodium 100 MG capsule Commonly known as:  COLACE Take 1 capsule (100 mg total) by mouth 2 (two) times daily.   ibuprofen 600 MG tablet Commonly known as:  ADVIL,MOTRIN Take 1  tablet (600 mg total) by mouth every 6 (six) hours.   prenatal multivitamin Tabs tablet Take 1 tablet by mouth daily.       Diet: routine diet  Activity: Advance as tolerated. Pelvic rest for 6 weeks.   Outpatient follow up:6 weeks Follow up Appt:Future Appointments Date Time Provider Department Center  07/20/2016 10:20 AM Marny Lowenstein, PA-C [facility]   Follow up Visit:No Follow-up on file.  Postpartum contraception: IUD Paragard  Newborn Data: Live born female  Birth Weight: 8 lb 5.9 oz (3796 g) APGAR: 9, 9  Baby Feeding: Breast Disposition:home with mother   06/09/2016 Jen Mow, DO OB Fellow

## 2016-06-09 NOTE — Discharge Instructions (Signed)

## 2016-06-09 NOTE — Lactation Note (Signed)
This note was copied from a baby's chart. Lactation Consultation Note: Mother reports that infant cluster fed last night. Ask how low this last. Mother informed that this is normal newborn behavior for several nights the first week of life. Mother reports that she is hearing her infant swallow. She denies having any soreness or nipple discomfort. Mother advised to continue to cue base feed infant and fed at least 8-12 times in 24 hours. Mother advised to breastfeed infant well when her milk comes in to keep breast from getting engorged. Mother has history of Mastitis , S/S reviewed. Mother is aware of available LC services.   Patient Name: Valerie Haynes [id number] Date: 06/09/2016 Reason for consult: Follow-up assessment   Maternal Data    Feeding Feeding Type: Breast Fed  LATCH Score/Interventions                      Lactation Tools Discussed/Used     Consult Status Consult Status: Complete    Michel Bickers 06/09/2016, 9:17 AM

## 2016-06-10 ENCOUNTER — Inpatient Hospital Stay (HOSPITAL_COMMUNITY): Admission: RE | Admit: 2016-06-10 | Payer: BLUE CROSS/BLUE SHIELD | Source: Ambulatory Visit

## 2016-07-01 ENCOUNTER — Telehealth: Payer: Self-pay | Admitting: *Deleted

## 2016-07-01 DIAGNOSIS — N61 Mastitis without abscess: Secondary | ICD-10-CM

## 2016-07-01 MED ORDER — AMOXICILLIN 875 MG PO TABS: 875.0000 mg | ORAL_TABLET | Freq: Two times a day (BID) | ORAL | 0 refills | Status: AC

## 2016-07-01 NOTE — Telephone Encounter (Signed)
Patient left message on nurse voicemail on 07/01/16 at 1036.  States she delivered on 06/07/16.  States she had some clogged milk ducts that she has gotten rid of but today she has tenderness in her right breast and a fever 100.9.  Wants to know should she come or what should she do?  Requests a return call to [phone].

## 2016-07-01 NOTE — Telephone Encounter (Signed)
Called patient stating I am returning your phone call. Patient states she has had several blocked ducts over the past couple of weeks in her left breast. Patient states she woke up this morning with a fever of 101.6 with chills. Patient states her right breast is tender but is not red or streaky. Patient also reports headache behind her eyes. States she took ibuprofen a couple hours ago and has felt slightly better since with a reduction in her fever to 99.8. Patient reports a history of recurrent mastitis with her last child as well as oversupply issues. Patient states she has been breastfeeding from one side at a time because she feels she is having oversupply issues again. Patient reports massaging breasts & hand expression to comfort on the side that isn't nursed. Discussed with patient that I understand oversupply is an issue as well but right now I am concerned about getting her breast thoroughly emptied each time to avoid milk stasis which is what leads to plugged ducts & mastitis. Discussed breastfeeding from both sides, starting on the right breast & that she may also try laid back breastfeeding. Patient verbalized understanding and states she has been trying laid back breastfeeding and found that helpful. Spoke with Dr Adrian BlackwaterStinson regarding symptoms and previous breastfeeding history. Dr Adrian BlackwaterStinson recommends proceeding with prophylactic mastitis treatment with Amoxicillin 875mg  BID x 10 days. Informed patient & discussed that she may find a probiotic helpful as well especially with warding off yeast. Patient verbalized understanding and states she is already taking a probiotic with 20 billion lactobacillus. Told patient that is a great dose and she may increase to twice a day if she wants/can tolerate during duration of antibiotic use. Discussed warm moist compresses, lots of rest, frequent emptying of both breasts, massage, drinking lots of fluids, warm showers with breast facing water & leaning forward.  Patient verbalized understanding to all. Discussed if she is concerned about oversupply in a couple days she may try taking sudafed once or twice OR 6-8 cups in a day of peppermint or sage tea. Told patient to use extreme caution with these as too much can dry up her supply altogether. Discussed with patient that I will call tomorrow afternoon to check on her. Recommend she start antibiotics today. Told her if she has additional questions she may call the office back and ask to speak with me. Patient verbalized understanding & was very appreciative. Patient had no questions at this time.

## 2016-07-02 ENCOUNTER — Telehealth: Payer: Self-pay | Admitting: General Practice

## 2016-07-02 NOTE — Telephone Encounter (Signed)
Called patient to check in to see how she is feeling. Patient states she has felt better today compared to yesterday. Patient states her fever came back last night and got up to 100 but she has started the antibiotics and feels better. Patient reports taking ibuprofen & tylenol off and on yesterday. Patient states she is feeding from both sides now and she starts with the last side she finished on. Patient states she can feel her breast filling up a little more since feeding on both sides. Patient states the baby does have a couple stretches where he sleeps for 5 hours at a time. Patient states her breast will get a little uncomfortable but not bad during that time. Told patient it's best to empty her breast no greater than every 3 hours. Told patient she can always hand express at that point and keep the milk in the fridge. Told patient if her baby wakes up sooner and she feels she cannot nurse yet she can cup feed baby stored milk. Explained cup feeding to patient. Also reminded patient of cold compresses/ice for 10 minutes if she gets engorged or uncomfortable. Also discussed cabbage leaves but limited short usage due to drying up milk. Patient verbalized understanding to all and will call back if she has questions or concerns. Patient had no questions

## 2016-07-20 ENCOUNTER — Encounter: Payer: Self-pay | Admitting: Medical

## 2016-07-20 ENCOUNTER — Ambulatory Visit (INDEPENDENT_AMBULATORY_CARE_PROVIDER_SITE_OTHER): Payer: BLUE CROSS/BLUE SHIELD | Admitting: Medical

## 2016-07-20 NOTE — Patient Instructions (Signed)

## 2016-07-20 NOTE — Progress Notes (Signed)
Subjective:     Valerie Haynes is a 26 y.o. female who presents for a postpartum visit. She is 6 weeks postpartum following a spontaneous vaginal delivery. I have fully reviewed the prenatal and intrapartum course. The delivery was at 40.4 gestational weeks. Outcome: spontaneous vaginal delivery. Anesthesia: none. Postpartum course has been notable for mastitis and she just completed a course of amoxicillin. Baby's course has been remarkable for hearing loss and hydronephrosis. [facility] is [facility] being [facility], [facility] and [facility]. Baby is feeding by breast. Bleeding no bleeding. Bowel function is abnormal: diarrhea for 3 days. Bladder function is normal. Patient is not sexually active. Contraception method is none. Postpartum depression screening: negative.  The following portions of the patient's history were reviewed and updated as appropriate: allergies, current medications, past family history, past medical history, past social history, past surgical history and problem list.  Review of Systems Pertinent items are noted in HPI.   Objective:    BP 108/64   Pulse 63   Ht 5\' 7"  (1.702 m)   Wt 162 lb (73.5 kg)   Breastfeeding? Yes   BMI 25.37 kg/m   General:  alert and cooperative   Breasts:  negative  Lungs: normal effort  Heart:  normal rate  Abdomen: soft, non-tender, no organomegaly   Vulva:  normal  Vagina: normal vagina  Cervix:  no cervical motion tenderness and no lesions  Corpus: normal size, contour, position, consistency, mobility, non-tender  Adnexa:  normal adnexa and no mass, fullness, tenderness  Rectal Exam: Not performed.        Assessment:     Normal postpartum exam. Pap smear not done at today's visit.  Due for pap 09/2016. Will schedule  Plan:    1. Contraception: abstinence and natural family planning - patient deciding about birth control options 2. Follow up in: 3 months for annual exam and pap smear or sooner if birth control is desired  or as needed.    Marny LowensteinWenzel, Piercen Covino N, PA-C 07/20/2016 11:20 AM

## 2016-08-23 ENCOUNTER — Other Ambulatory Visit: Payer: Self-pay | Admitting: Obstetrics & Gynecology

## 2016-08-23 DIAGNOSIS — O9123 Nonpurulent mastitis associated with lactation: Secondary | ICD-10-CM

## 2016-08-23 MED ORDER — SULFAMETHOXAZOLE-TRIMETHOPRIM 800-160 MG PO TABS: 1.0000 | ORAL_TABLET | Freq: Two times a day (BID) | ORAL | 1 refills | Status: DC

## 2016-08-23 NOTE — Progress Notes (Signed)
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 I called patient and talked to her over the phone.  She has been treated successfully in the past with antibiotics. Low grade fever in the low 100s, some redness around the breast, no masses, no other systemic symptoms.  Bactrim DS twice a day for 10 days prescribed for patient. She was advised to come in to office or ED for worsening symptoms.   Jaynie CollinsUGONNA  [name], MD, FACOG Attending Obstetrician & Gynecologist, [facility], [facility]

## 2016-09-07 ENCOUNTER — Telehealth: Payer: Self-pay | Admitting: General Practice

## 2016-09-07 DIAGNOSIS — N61 Mastitis without abscess: Secondary | ICD-10-CM

## 2016-09-07 MED ORDER — DICLOXACILLIN SODIUM 500 MG PO CAPS: 500.0000 mg | ORAL_CAPSULE | Freq: Four times a day (QID) | ORAL | 0 refills | Status: AC

## 2016-09-07 NOTE — Telephone Encounter (Signed)
Patient left message on nurse line stating she has tenderness/redness in her left breast and has already had mastitis 2 other times. Patient is calling to get an antibiotic before the weekend gets here and we are closed. Called patient and she states the tenderness/redness went away yesterday in her left breast but now she has it in her right breast. Patient states she just finished antibiotics a week and a half ago for mastitis in her left breast. Patient reports a low grade fever on Friday of 99.2. Patient states her baby is currently feeding every 4 hours but she offers the breast every 3 hours. Patient states baby goes for 9 hour stretches at night without feeding. Patient states redness is currently more diffuse rather than the wedge she is used to seeing. Patient does report an area with firmness in her breast. Per Dr Vergie LivingPickens may go ahead and start patient on dicloxacillin. Informed patient of antibiotic Rx. Patient asked if she should really go ahead and start taking the antibiotic or is there something else she can do. Discussed with patient that plugged ducts usually come before mastitis. Discussed warm compresses & massage behind area of plugged duct, leaning forward in a warm shower while massaging, frequent hand expression or pumping every 2-3 hours even at night especially right now. Discussed making sure her bra strap isn't putting pressure on one particular spot of her breast & switching up what side she carries the car seat on or holds baby. Told patient that she could start the antibiotics tonight to see if plugged duct removal suggestions are helpful throughout the day but if she develops a fever she should start antibiotics immediately. Told Patient I would expect her to be feeling better by tomorrow but if not to call me back. Patient verbalized understanding to all & had no questions

## 2016-09-22 ENCOUNTER — Inpatient Hospital Stay (HOSPITAL_COMMUNITY)
Admission: AD | Admit: 2016-09-22 | Discharge: 2016-09-22 | Disposition: A | Discharge status: Home / Self Care | Payer: BLUE CROSS/BLUE SHIELD | Source: Ambulatory Visit | Attending: Family Medicine | Admitting: Family Medicine

## 2016-09-22 DIAGNOSIS — N61 Mastitis without abscess: Secondary | ICD-10-CM | POA: Diagnosis not present

## 2016-09-22 DIAGNOSIS — Z87891 Personal history of nicotine dependence: Secondary | ICD-10-CM | POA: Insufficient documentation

## 2016-09-22 DIAGNOSIS — N644 Mastodynia: Secondary | ICD-10-CM | POA: Diagnosis present

## 2016-09-22 LAB — URINALYSIS, ROUTINE W REFLEX MICROSCOPIC
BILIRUBIN URINE: NEGATIVE
Glucose, UA: NEGATIVE mg/dL
Hgb urine dipstick: NEGATIVE
KETONES UR: NEGATIVE mg/dL
Nitrite: NEGATIVE
PH: 7 (ref 5.0–8.0)
Protein, ur: 30 mg/dL — AB
Specific Gravity, Urine: 1.024 (ref 1.005–1.030)

## 2016-09-22 MED ORDER — SULFAMETHOXAZOLE-TRIMETHOPRIM 800-160 MG PO TABS: 1.0000 | ORAL_TABLET | Freq: Two times a day (BID) | ORAL | 0 refills | Status: DC

## 2016-09-22 MED ORDER — IBUPROFEN 600 MG PO TABS: 600.0000 mg | ORAL_TABLET | Freq: Four times a day (QID) | ORAL | 1 refills | Status: DC | PRN

## 2016-09-22 NOTE — MAU Note (Signed)
Pain in her right breast around 1200.  Hand expressed some milk.  Painful to the touch.  Fever 99.5 at noon, 102.5 at 1700  (took 600mg  of ibuprofen) and 103.5 at 1740.  Noticed some redness on right breast.  Has had recurrence of mastitis.

## 2016-09-22 NOTE — Discharge Instructions (Signed)
Breastfeeding and Mastitis  Mastitis is inflammation of the breast tissue. It can occur in women who are breastfeeding. This can make breastfeeding painful. Mastitis will sometimes go away on its own, especially if it is not caused by an infection (non-infectious mastitis). Your health care provider will help determine if medical treatment is needed. Treatment may be needed if the condition is caused by a bacterial infection (infectious mastitis).  What are the causes?  This condition is often associated with a blocked milkduct, which can happen when too much milk builds up in the breast. Causes of excess milk in the breast can include:  · Poor latch-on. If your baby is not latched onto the breast properly, he or she may not empty your breast completely while breastfeeding.  · Allowing too much time to pass between feedings.  · Wearing a bra or other clothing that is too tight. This puts extra pressure on the milk ducts so milk does not flow through them as it should.  · Milk remaining in the breast because it is overfilled (engorged).  · Stress and fatigue.    Mastitis can also be caused by a bacterial infection. Bacteria may enter the breast tissue through cuts, cracks, or openings in the skin near the nipple area. Cracks in the skin are often caused when your baby does not latch on properly to the breast.  What are the signs or symptoms?  Symptoms of this condition include:  · Swelling, redness, tenderness, and pain in an area of the breast. This usually affects the upper part of the breast, toward the armpit region. In most cases, it affects only one breast. In some cases, it may occur on both breasts at the same time and affect a larger portion of breast tissue.  · Swelling of the glands under the arm on the same side.  · Fatigue, headache, and flu-like muscle aches.  · Fever.  · Rapid pulse.    Symptoms usually last 2 to 5 days. Breast pain and redness are at their worst on day 2 and day 3, and they usually go  away by day 5. If an infection is left to progress, a collection of pus (abscess) may develop.  How is this diagnosed?  This condition can be diagnosed based on your symptoms and a physical exam. You may also have tests, such as:  · Blood tests to determine if your body is fighting a bacterial infection.  · Mammogram or ultrasound tests to rule out other problems or diseases.  · Fluid tests. If an abscess has developed, the fluid in the abscess may be removed with a needle. The fluid may be analyzed to determine if bacteria are present.  · Breast milk may be cultured and tested for bacteria.    How is this treated?  This condition will sometimes go away on its own. Your health care provider may choose to wait 24 hours after first seeing you to decide whether treatment is needed. If treatment is needed, it may include:  · Strategies to manage breastfeeding. This includes continuing to breastfeed or pump in order to allow adequate milk flow, using breast massage, and applying heat or cold to the affected area.  · Self-care such as rest and increased fluid intake.  · Medicine for pain.  · Antibiotic medicine to treat a bacterial infection. This is usually taken by mouth.  · If an abscess has developed, it may be treated by removing fluid with a needle.      Follow these instructions at home:  Medicines  · Take over-the-counter and prescription medicines only as told by your health care provider.  · If you were prescribed an antibiotic medicine, take it as told by your health care provider. Do not stop taking the antibiotic even if you start to feel better.  General instructions  · Do not wear a tight or underwire bra. Wear a soft, supportive bra.  · Increase your fluid intake, especially if you have a fever.  · Get plenty of rest.  For breastfeeding:  · Continue to empty your breasts as often as possible, either by breastfeeding or using an electric breast pump. This will lower the pressure and the pain that comes with  it. Ask your health care provider if changes need to be made to your breastfeeding or pumping routine.  · Keep your nipples clean and dry.  · During breastfeeding, empty the first breast completely before going to the other breast. If your baby is not emptying your breasts completely, use a breast pump to empty your breasts.  · Use breast massage during feeding or pumping sessions.  · If directed, apply moist heat to the affected area of your breast right before breastfeeding or pumping. Use the heat source that your health care provider recommends.  · If directed, put ice on the affected area of your breast right after breastfeeding or pumping:  ? Put ice in a plastic bag.  ? Place a towel between your skin and the bag.  ? Leave the ice on for 20 minutes.  · If you go back to work, pump your breasts while at work to stay in time with your nursing schedule.  · Do not allow your breasts to become engorged.  Contact a health care provider if:  · You have pus-like discharge from the breast.  · You have a fever.  · Your symptoms do not improve within 2 days of starting treatment.  · Your symptoms return after you have recovered from a breast infection.  Get help right away if:  · Your pain and swelling are getting worse.  · You have pain that is not controlled with medicine.  · You have a red line extending from the breast toward your armpit.  Summary  · Mastitis is inflammation of the breast tissue. It is often caused by a blocked milk duct or bacteria.  · This condition may be treated with hot and cold compresses, medicines, self-care, and certain breastfeeding strategies.  · If you were prescribed an antibiotic medicine, take it as told by your health care provider. Do not stop taking the antibiotic even if you start to feel better.  · Continue to empty your breasts as often as possible either by breastfeeding or using an electric breast pump.  This information is not intended to replace advice given to you by your  health care provider. Make sure you discuss any questions you have with your health care provider.  Document Released: 05/30/2004 Document Revised: 02/04/2016 Document Reviewed: 02/04/2016  Elsevier Interactive Patient Education © 2017 [employer]

## 2016-09-22 NOTE — MAU Provider Note (Signed)
Chief Complaint:  Breast Pain   Seen by provider at 1830 hrs    HPI: Valerie Haynes is a 26 y.o. G2P2002 who presents to maternity admissions reporting pain in right breast with high fevers at home.  Has redness in one area of right breast, outer aspect.  . She reports no vaginal bleeding, vaginal itching/burning, urinary symptoms, h/a, dizziness, n/v.    Has been treated twice for mastitis since delivery in April.  Never took second antibiotic, as it had seemed to clear up.  Still has bottle of Dicloxicillin.   Fever   This is a recurrent problem. The current episode started in the past 7 days. The problem occurs intermittently. The problem has been waxing and waning. The maximum temperature noted was 102 to 102.9 F. The temperature was taken using an oral thermometer. Pertinent negatives include no abdominal pain, chest pain, congestion, headaches, nausea, sore throat, urinary pain, vomiting or wheezing. Associated symptoms comments: Painful reddened breast on right. She has tried nothing for the symptoms.   RN Note: Pain in her right breast around 1200.  Hand expressed some milk.  Painful to the touch.  Fever 99.5 at noon, 102.5 at 1700  (took 600mg  of ibuprofen) and 103.5 at 1740.  Noticed some redness on right breast.  Has had recurrence of mastitis.  Past Medical History: Past Medical History:  Diagnosis Date  . Chronic Epstein Barr virus (EBV) infection   . Depression   . Hashimoto's thyroiditis   . IBS (irritable bowel syndrome)     Past obstetric history: OB History  Gravida Para Term Preterm AB Living  2 2 2     2   SAB TAB Ectopic Multiple Live Births        0 2    # Outcome Date GA Lbr Len/2nd Weight Sex Delivery Anes PTL Lv  2 Term 06/07/16 1024w4d  8 lb 5.9 oz (3.796 kg) M Vag-Spont None  LIV  1 Term 05/12/14 761w0d  7 lb 1 oz (3.204 kg) M Vag-Spont None N LIV     Birth Comments: fhr dropped due to nucal and arm presentation. [facility]       Past  Surgical History: Past Surgical History:  Procedure Laterality Date  . NO PAST SURGERIES    . WISDOM TOOTH EXTRACTION      Family History: Family History  Problem Relation Age of Onset  . Rheum arthritis Father     Social History: Social History  Substance Use Topics  . Smoking status: Former Smoker    Types: Cigarettes    Quit date: 01/27/2011  . Smokeless tobacco: Former NeurosurgeonUser     Comment: social smoking only before  . Alcohol use Yes     Comment: socially, rare 1/2 glass wine during pregnancy    Allergies:  Allergies  Allergen Reactions  . Milk-Related Compounds Anaphylaxis  . Peanut-Containing Drug Products Anaphylaxis    Meds:  Prescriptions Prior to Admission  Medication Sig Dispense Refill Last Dose  . acidophilus (RISAQUAD) CAPS capsule Take 1 capsule by mouth daily.   Taking  . docusate sodium (COLACE) 100 MG capsule Take 1 capsule (100 mg total) by mouth 2 (two) times daily. (Patient not taking: Reported on 07/20/2016) 30 capsule 0 Not Taking  . ibuprofen (ADVIL,MOTRIN) 600 MG tablet Take 1 tablet (600 mg total) by mouth every 6 (six) hours. (Patient not taking: Reported on 07/20/2016) 30 tablet 0 Not Taking  . Prenatal Vit-Fe Fumarate-FA (PRENATAL MULTIVITAMIN) TABS tablet Take  1 tablet by mouth daily.   Taking  . sulfamethoxazole-trimethoprim (BACTRIM DS,SEPTRA DS) 800-160 MG tablet Take 1 tablet by mouth 2 (two) times daily. Ten days 20 tablet 1     I have reviewed patient's Past Medical Hx, Surgical Hx, Family Hx, Social Hx, medications and allergies.  ROS:  Review of Systems  Constitutional: Positive for fever.  HENT: Negative for congestion and sore throat.   Respiratory: Negative for wheezing.   Cardiovascular: Negative for chest pain.  Gastrointestinal: Negative for abdominal pain, nausea and vomiting.  Genitourinary: Negative for dysuria.  Neurological: Negative for headaches.   Other systems negative     Physical Exam  Patient Vitals for the  past 24 hrs:  BP Temp Temp src Pulse Resp Weight  09/22/16 1823 114/73 (!) 100.7 F (38.2 C) Oral (!) 103 17 -  09/22/16 1814 - - - - - 156 lb 1.3 oz (70.8 kg)   Constitutional: Well-developed, well-nourished female in no acute distress.  Cardiovascular: normal rate and rhythm, no ectopy audible, S1 & S2 heard, no murmur Respiratory: normal effort, no distress. Lungs CTAB with no wheezes or crackles GI: Abd soft, non-tender.  Nondistended.  No rebound, No guarding.  Bowel Sounds audible  MS: Extremities nontender, no edema, normal ROM Neurologic: Alert and oriented x 4.   Grossly nonfocal. GU: Neg CVAT. Skin:  Warm and Dry    There is nodularity in right breast.  Small area of erethema and tenderness on right outer breast at 9:00.   No obvious abscess.  Milk expressed is light white.  No dimpling Psych:  Affect appropriate.  PELVIC EXAM: deferred   Labs: --/--/O POS, O POS (04/22 1714) Breast milk culture sent  Imaging:  No results found.  MAU Course/MDM: I have ordered labs as follows:  Breast milk culture Imaging ordered: Outpatient breast ultrasound to rule out abscess or inflammatory breast cancer (low suspicion), since this is recurrent   Treatments in MAU included none  Discussed keeping breast emptied.  Tylenol for fever or may use ibuprofen. New Rx provided.  Will start her on Bactrim DS to cover possible MRSA, per UpToDate..    Pt stable at time of discharge.  Assessment: Mastitis right breast  Plan: Discharge home Recommend Keep breasts emptied.  Motrin or Tylenol for fever and pain. Rx sent for Bactrim Ds for Mastitis Rx sent for Motrin for pain/fever   Encouraged to return here or to other Urgent Care/ED if she develops worsening of symptoms, increase in pain, fever, or other concerning symptoms.   Wynelle Bourgeois CNM, MSN Certified Nurse-Midwife 09/22/2016 6:39 PM

## 2016-09-23 ENCOUNTER — Other Ambulatory Visit: Payer: Self-pay | Admitting: Advanced Practice Midwife

## 2016-09-23 ENCOUNTER — Telehealth: Payer: Self-pay | Admitting: *Deleted

## 2016-09-23 ENCOUNTER — Encounter (HOSPITAL_COMMUNITY): Payer: Self-pay | Admitting: *Deleted

## 2016-09-23 ENCOUNTER — Other Ambulatory Visit (HOSPITAL_COMMUNITY): Payer: Self-pay | Admitting: Advanced Practice Midwife

## 2016-09-23 ENCOUNTER — Inpatient Hospital Stay (HOSPITAL_COMMUNITY)
Admission: AD | Admit: 2016-09-23 | Discharge: 2016-09-23 | Disposition: A | Discharge status: Home / Self Care | Payer: BLUE CROSS/BLUE SHIELD | Source: Ambulatory Visit | Attending: Obstetrics and Gynecology | Admitting: Obstetrics and Gynecology

## 2016-09-23 DIAGNOSIS — Z91010 Allergy to peanuts: Secondary | ICD-10-CM | POA: Diagnosis not present

## 2016-09-23 DIAGNOSIS — N61 Mastitis without abscess: Secondary | ICD-10-CM

## 2016-09-23 DIAGNOSIS — M436 Torticollis: Secondary | ICD-10-CM | POA: Diagnosis present

## 2016-09-23 DIAGNOSIS — Z87891 Personal history of nicotine dependence: Secondary | ICD-10-CM | POA: Diagnosis not present

## 2016-09-23 DIAGNOSIS — Z8261 Family history of arthritis: Secondary | ICD-10-CM | POA: Diagnosis not present

## 2016-09-23 DIAGNOSIS — Z91011 Allergy to milk products: Secondary | ICD-10-CM | POA: Diagnosis not present

## 2016-09-23 MED ORDER — DICLOXACILLIN SODIUM 500 MG PO CAPS: 500.0000 mg | ORAL_CAPSULE | Freq: Four times a day (QID) | ORAL | 0 refills | Status: DC

## 2016-09-23 NOTE — Telephone Encounter (Signed)
Valerie Haynes called this am and left a voice message that she went to [facility] yesterday for mastitis and had temp of 103 degrees. States it broke this am and now temperature 95.9. States doesn't know if this is cause for concern. Discussed with Valerie Haynes, Valerie Haynes and I called Valerie Haynes back and we discussed that she had not drank anything before taking that temperature. States she has been checking her temperature about hourly and it broke during the night and since the 95.9 has gone back to 97.0. She was taking tylenol and ibuprofen alternating every 3 hours. States now not taking that often. States she still feels bad like she did yesterday but not worse. We discussed she should now only take the tylenol or motrin as needed . If her temperature stays low ( less than 97) or she feels worse she should come back to [facility]. We discussed she should be taking her antibiotic as ordered and she states she is.  We also discussed she is supposed to be getting a breast ultrasound to rule out abcess. She states she was called with an appointment- it is 10/07/16. I informed her it needs to be this week and I will call and get it changed and call her back.

## 2016-09-23 NOTE — Telephone Encounter (Signed)
I called the breast center and changed her breast ultrasound to tomorrow at 11am and called Tresa EndoKelly and notified her. I also reminded her to go to MAU if she feels worse, temperature goes back up or stays low. She voices understanding.

## 2016-09-23 NOTE — MAU Provider Note (Signed)
History     CSN: [id number]  Arrival date and time: 09/23/16 2036   None     Chief Complaint  Patient presents with  . neck stiffness   HPI Valerie Haynes is a 26 year old gravida 2 para 2002 evaluated here tonight 24 hours after evaluation here for mastitis at which time she was placed on Bactrim. She is a history recurrent mastitis since delivery in April and has a prescription for dicloxacillin at home. She returns because of an erratic temperature curve today. She has a normal baseline temperature 97.2- to 97.5 and noted temperature in the 95.6-96.5 range earlier this morning and then had a temperature 100.9 tonight. She's had mild myalgias including what she described as neck stiffness but on further questioning is tenderness in the trapezius muscles. She has range of motion at 90 rotation of the head and she can easily touch her chin to her chest Additionally she has noticed a slight area of redness on the left breast approximately 1.5 cm diameter at 3:00. She is breast-feeding every 3 hours and manually expressing milk when the baby sleeps through the night which is quite often. Vital statistics at present are completely normal with pulse 79 blood pressure 110 systolic, oxygen saturation 100% on room air  History recurrent mastitis see note from last night She has had fever described as fevers up to 100.9 degrees.  Past Medical History:  Diagnosis Date  . Chronic Epstein Barr virus (EBV) infection   . Depression   . Hashimoto's thyroiditis   . IBS (irritable bowel syndrome)     Past Surgical History:  Procedure Laterality Date  . NO PAST SURGERIES    . WISDOM TOOTH EXTRACTION      Family History  Problem Relation Age of Onset  . Rheum arthritis Father     Social History  Substance Use Topics  . Smoking status: Former Smoker    Types: Cigarettes    Quit date: 01/27/2011  . Smokeless tobacco: Former Neurosurgeon     Comment: social smoking only before  . Alcohol use Yes      Comment: socially, rare 1/2 glass wine during pregnancy    Allergies:  Allergies  Allergen Reactions  . Milk-Related Compounds Anaphylaxis  . Peanut-Containing Drug Products Anaphylaxis    Prescriptions Prior to Admission  Medication Sig Dispense Refill Last Dose  . ibuprofen (ADVIL,MOTRIN) 600 MG tablet Take 1 tablet (600 mg total) by mouth every 6 (six) hours as needed. 30 tablet 1 09/23/2016 at 1800  . sulfamethoxazole-trimethoprim (BACTRIM DS,SEPTRA DS) 800-160 MG tablet Take 1 tablet by mouth 2 (two) times daily. 20 tablet 0 09/23/2016 at Unknown time  . acidophilus (RISAQUAD) CAPS capsule Take 1 capsule by mouth daily.   Taking  . docusate sodium (COLACE) 100 MG capsule Take 1 capsule (100 mg total) by mouth 2 (two) times daily. (Patient not taking: Reported on 07/20/2016) 30 capsule 0 Not Taking  . ibuprofen (ADVIL,MOTRIN) 600 MG tablet Take 1 tablet (600 mg total) by mouth every 6 (six) hours. (Patient not taking: Reported on 07/20/2016) 30 tablet 0 Not Taking  . Prenatal Vit-Fe Fumarate-FA (PRENATAL MULTIVITAMIN) TABS tablet Take 1 tablet by mouth daily.   Taking    Review of Systems she considers recurrent mastitis were very much within the range of mastitis episodes in the past Physical Exam   Blood pressure 103/77, pulse 74, temperature 98.7 F (37.1 C), resp. rate 16, SpO2 100 %, currently breastfeeding.  Physical Exam  Constitutional: She is oriented  to person, place, and time. She appears well-developed and well-nourished. No distress.  HENT:  Head: Normocephalic and atraumatic.  Eyes: Pupils are equal, round, and reactive to light.  Neck: Normal range of motion. Neck supple.  Able to easily touch her chin to the chest and the cup of the saline without anything more than mild discomfort in the trapezius muscles  Cardiovascular: Normal rate.   Respiratory: Effort normal.  Bilateral breast exam shows no tenderness in the nipple area. There is a minimal area 1.5 cm in  diameter on the right breast at 9:00, halfway to the chest wall where there is very slight erythema without hardness. On the left side there is a similar matching area with slight deep hardness approximate 1.5 cm diameter deep in the tissue considered to be consistent with mastitis  GI: Soft. She exhibits no distension. There is no tenderness. There is no rebound.  Neurological: She is alert and oriented to person, place, and time.  Skin: Skin is warm and dry. She is not diaphoretic. No erythema.    MAU Course  Procedures  MDM Review of history, examination discussion with the patient  Assessment and Plan  Recurrent early mastitis, no evidence of acute sepsis or meningitis Plan we'll switch medicine to dicloxacillin. Since patient has prescription at home she'll simply start this when she gets home tonight Additionally she has a and breast ultrasound tomorrow scheduled in the ultrasound department here and she'll let them know if there is any deterioration condition [name] V 09/23/2016, 9:49 PM

## 2016-09-23 NOTE — Progress Notes (Signed)
Order amended

## 2016-09-23 NOTE — MAU Note (Signed)
Pt took ibuprofen around 1800 and has had a fever of 100.2 at 1900.  Reports a temperature of 95.9 this morning.  Started antibiotic last night for possible mastitis.  Pt started feeling neck stiffness around 1400 and her back started hurting around 1700.  States the redness on her breast has spread to both sides since last night where it was only on the right side.

## 2016-09-23 NOTE — Discharge Instructions (Signed)
Please keep appointment in the morning for your breast ultrasound, unless symptoms are completely resolved cancellation is planned please call and notify the office as soon is convenient   Breastfeeding and Mastitis Mastitis is inflammation of the breast tissue. It can occur in women who are breastfeeding. This can make breastfeeding painful. Mastitis will sometimes go away on its own, especially if it is not caused by an infection (non-infectious mastitis). Your health care provider will help determine if medical treatment is needed. Treatment may be needed if the condition is caused by a bacterial infection (infectious mastitis). What are the causes? This condition is often associated with a blocked milkduct, which can happen when too much milk builds up in the breast. Causes of excess milk in the breast can include:  Poor latch-on. If your baby is not latched onto the breast properly, he or she may not empty your breast completely while breastfeeding.  Allowing too much time to pass between feedings.  Wearing a bra or other clothing that is too tight. This puts extra pressure on the milk ducts so milk does not flow through them as it should.  Milk remaining in the breast because it is overfilled (engorged).  Stress and fatigue.  Mastitis can also be caused by a bacterial infection. Bacteria may enter the breast tissue through cuts, cracks, or openings in the skin near the nipple area. Cracks in the skin are often caused when your baby does not latch on properly to the breast. What are the signs or symptoms? Symptoms of this condition include:  Swelling, redness, tenderness, and pain in an area of the breast. This usually affects the upper part of the breast, toward the armpit region. In most cases, it affects only one breast. In some cases, it may occur on both breasts at the same time and affect a larger portion of breast tissue.  Swelling of the glands under the arm on the same  side.  Fatigue, headache, and flu-like muscle aches.  Fever.  Rapid pulse.  Symptoms usually last 2 to 5 days. Breast pain and redness are at their worst on day 2 and day 3, and they usually go away by day 5. If an infection is left to progress, a collection of pus (abscess) may develop. How is this diagnosed? This condition can be diagnosed based on your symptoms and a physical exam. You may also have tests, such as:  Blood tests to determine if your body is fighting a bacterial infection.  Mammogram or ultrasound tests to rule out other problems or diseases.  Fluid tests. If an abscess has developed, the fluid in the abscess may be removed with a needle. The fluid may be analyzed to determine if bacteria are present.  Breast milk may be cultured and tested for bacteria.  How is this treated? This condition will sometimes go away on its own. Your health care provider may choose to wait 24 hours after first seeing you to decide whether treatment is needed. If treatment is needed, it may include:  Strategies to manage breastfeeding. This includes continuing to breastfeed or pump in order to allow adequate milk flow, using breast massage, and applying heat or cold to the affected area.  Self-care such as rest and increased fluid intake.  Medicine for pain.  Antibiotic medicine to treat a bacterial infection. This is usually taken by mouth.  If an abscess has developed, it may be treated by removing fluid with a needle.  Follow these instructions at home:  Medicines  Take over-the-counter and prescription medicines only as told by your health care provider.  If you were prescribed an antibiotic medicine, take it as told by your health care provider. Do not stop taking the antibiotic even if you start to feel better. General instructions  Do not wear a tight or underwire bra. Wear a soft, supportive bra.  Increase your fluid intake, especially if you have a fever.  Get plenty  of rest. For breastfeeding:  Continue to empty your breasts as often as possible, either by breastfeeding or using an electric breast pump. This will lower the pressure and the pain that comes with it. Ask your health care provider if changes need to be made to your breastfeeding or pumping routine.  Keep your nipples clean and dry.  During breastfeeding, empty the first breast completely before going to the other breast. If your baby is not emptying your breasts completely, use a breast pump to empty your breasts.  Use breast massage during feeding or pumping sessions.  If directed, apply moist heat to the affected area of your breast right before breastfeeding or pumping. Use the heat source that your health care provider recommends.  If directed, put ice on the affected area of your breast right after breastfeeding or pumping: ? Put ice in a plastic bag. ? Place a towel between your skin and the bag. ? Leave the ice on for 20 minutes.  If you go back to work, pump your breasts while at work to stay in time with your nursing schedule.  Do not allow your breasts to become engorged. Contact a health care provider if:  You have pus-like discharge from the breast.  You have a fever.  Your symptoms do not improve within 2 days of starting treatment.  Your symptoms return after you have recovered from a breast infection. Get help right away if:  Your pain and swelling are getting worse.  You have pain that is not controlled with medicine.  You have a red line extending from the breast toward your armpit. Summary  Mastitis is inflammation of the breast tissue. It is often caused by a blocked milk duct or bacteria.  This condition may be treated with hot and cold compresses, medicines, self-care, and certain breastfeeding strategies.  If you were prescribed an antibiotic medicine, take it as told by your health care provider. Do not stop taking the antibiotic even if you start to  feel better.  Continue to empty your breasts as often as possible either by breastfeeding or using an electric breast pump. This information is not intended to replace advice given to you by your health care provider. Make sure you discuss any questions you have with your health care provider. Document Released: 05/30/2004 Document Revised: 02/04/2016 Document Reviewed: 02/04/2016 Elsevier Interactive Patient Education  2017 [employer].

## 2016-09-24 ENCOUNTER — Ambulatory Visit
Admission: RE | Admit: 2016-09-24 | Discharge: 2016-09-24 | Disposition: A | Discharge status: Home / Self Care | Payer: BLUE CROSS/BLUE SHIELD | Source: Ambulatory Visit | Attending: Advanced Practice Midwife | Admitting: Advanced Practice Midwife

## 2016-09-24 ENCOUNTER — Other Ambulatory Visit: Payer: Self-pay | Admitting: Advanced Practice Midwife

## 2016-09-24 ENCOUNTER — Inpatient Hospital Stay (HOSPITAL_COMMUNITY)
Admission: AD | Admit: 2016-09-24 | Discharge: 2016-09-24 | Disposition: A | Discharge status: Home / Self Care | Payer: BLUE CROSS/BLUE SHIELD | Source: Ambulatory Visit | Attending: Obstetrics and Gynecology | Admitting: Obstetrics and Gynecology

## 2016-09-24 DIAGNOSIS — Z9889 Other specified postprocedural states: Secondary | ICD-10-CM | POA: Diagnosis not present

## 2016-09-24 DIAGNOSIS — Z91011 Allergy to milk products: Secondary | ICD-10-CM | POA: Insufficient documentation

## 2016-09-24 DIAGNOSIS — K589 Irritable bowel syndrome without diarrhea: Secondary | ICD-10-CM | POA: Diagnosis not present

## 2016-09-24 DIAGNOSIS — Z87891 Personal history of nicotine dependence: Secondary | ICD-10-CM | POA: Diagnosis not present

## 2016-09-24 DIAGNOSIS — N61 Mastitis without abscess: Secondary | ICD-10-CM | POA: Diagnosis not present

## 2016-09-24 DIAGNOSIS — E063 Autoimmune thyroiditis: Secondary | ICD-10-CM | POA: Insufficient documentation

## 2016-09-24 DIAGNOSIS — Z8619 Personal history of other infectious and parasitic diseases: Secondary | ICD-10-CM | POA: Insufficient documentation

## 2016-09-24 DIAGNOSIS — R42 Dizziness and giddiness: Secondary | ICD-10-CM | POA: Diagnosis present

## 2016-09-24 DIAGNOSIS — Z91010 Allergy to peanuts: Secondary | ICD-10-CM | POA: Insufficient documentation

## 2016-09-24 DIAGNOSIS — R509 Fever, unspecified: Secondary | ICD-10-CM | POA: Diagnosis present

## 2016-09-24 NOTE — MAU Provider Note (Signed)
Chief Complaint: Fever and Dizziness   None     SUBJECTIVE HPI: Valerie Haynes is a 26 y.o. G2P2002 who presents to maternity admissions reporting worsening fever at home. She was discharged from MAU 5-6 hours ago with second visit in 24 hours with diagnosis of mastitis. She reports body aches, especially her neck and upper back, and fever of 102 at home. She called the nurse line and was asked some questions and when she reported lightheadedness with her fever, she was told to come in to MAU.  She denies any shortness of breath, chest pain, palpitations or racing heartrate, or n/v.  She is drinking plenty of fluids. She has not started her new antibiotic, dicloxacillin as prescribed tonight by Dr Emelda FearFerguson since she had taken Bactrim a few hours earlier. She plans to start the new medication tomorrow. She took ibuprofen at home for her fever but 1 hour later it was still 102.   She denies vaginal bleeding, vaginal itching/burning, urinary symptoms, h/a, dizziness, n/v.   HPI  Past Medical History:  Diagnosis Date  . Chronic Epstein Barr virus (EBV) infection   . Depression   . Hashimoto's thyroiditis   . IBS (irritable bowel syndrome)    Past Surgical History:  Procedure Laterality Date  . NO PAST SURGERIES    . WISDOM TOOTH EXTRACTION     Social History   Social History  . Marital status: Married    Spouse name: N/A  . Number of children: N/A  . Years of education: N/A   Occupational History  . Not on file.   Social History Main Topics  . Smoking status: Former Smoker    Types: Cigarettes    Quit date: 01/27/2011  . Smokeless tobacco: Former NeurosurgeonUser     Comment: social smoking only before  . Alcohol use Yes     Comment: socially, rare 1/2 glass wine during pregnancy  . Drug use: Yes    Types: Marijuana     Comment: history as teen  . Sexual activity: Yes    Birth control/ protection: None   Other Topics Concern  . Not on file   Social History Narrative  . No  narrative on file   No current facility-administered medications on file prior to encounter.    Current Outpatient Prescriptions on File Prior to Encounter  Medication Sig Dispense Refill  . acidophilus (RISAQUAD) CAPS capsule Take 1 capsule by mouth daily.    . dicloxacillin (DYNAPEN) 500 MG capsule Take 1 capsule (500 mg total) by mouth 4 (four) times daily. 28 capsule 0  . docusate sodium (COLACE) 100 MG capsule Take 1 capsule (100 mg total) by mouth 2 (two) times daily. (Patient not taking: Reported on 07/20/2016) 30 capsule 0  . ibuprofen (ADVIL,MOTRIN) 600 MG tablet Take 1 tablet (600 mg total) by mouth every 6 (six) hours. (Patient not taking: Reported on 07/20/2016) 30 tablet 0  . ibuprofen (ADVIL,MOTRIN) 600 MG tablet Take 1 tablet (600 mg total) by mouth every 6 (six) hours as needed. 30 tablet 1  . Prenatal Vit-Fe Fumarate-FA (PRENATAL MULTIVITAMIN) TABS tablet Take 1 tablet by mouth daily.     Allergies  Allergen Reactions  . Milk-Related Compounds Anaphylaxis  . Peanut-Containing Drug Products Anaphylaxis    ROS:  Review of Systems  Constitutional: Positive for chills and fever. Negative for fatigue.  Respiratory: Negative for shortness of breath.   Cardiovascular: Negative for chest pain.  Genitourinary: Negative for difficulty urinating, dysuria, flank pain, pelvic pain, vaginal  bleeding, vaginal discharge and vaginal pain.  Musculoskeletal: Positive for myalgias and neck pain.  Neurological: Negative for dizziness and headaches.  Psychiatric/Behavioral: Negative.      I have reviewed patient's Past Medical Hx, Surgical Hx, Family Hx, Social Hx, medications and allergies.   Physical Exam   Patient Vitals for the past 24 hrs:  BP Temp Temp src Pulse Resp SpO2 Height Weight  09/24/16 0331 99/66 (!) 100.5 F (38.1 C) Oral 98 18 98 % 5\' 7"  (1.702 m) 156 lb (70.8 kg)   Constitutional: Well-developed, well-nourished female in no acute distress.  Cardiovascular: normal  rate Respiratory: normal effort GI: Abd soft, non-tender. Pos BS x 4 MS: Extremities nontender, no edema, normal ROM Neurologic: Alert and oriented x 4.  GU: Neg CVAT.   LAB RESULTS No results found for this or any previous visit (from the past 24 hour(s)).  --/--/O POS, O POS (04/22 1714)  IMAGING No results found.  MAU Management/MDM: Discussed normal course of mastitis with pt. She has had frequent mild mastitis but has never felt this bad. Reassurance provided that body aches are common with mastitis.  Pt to start dicloxacillin now.  Increase PO fluids. Alternate Tylenol and ibuprofen.  F/U in 24 hours if symptoms worsen and do not improve. Pt stable at time of discharge.  ASSESSMENT 1. Mastitis     PLAN Discharge home Allergies as of 09/24/2016      Reactions   Milk-related Compounds Anaphylaxis   Peanut-containing Drug Products Anaphylaxis      Medication List    TAKE these medications   acidophilus Caps capsule Take 1 capsule by mouth daily.   dicloxacillin 500 MG capsule Commonly known as:  DYNAPEN Take 1 capsule (500 mg total) by mouth 4 (four) times daily.   docusate sodium 100 MG capsule Commonly known as:  COLACE Take 1 capsule (100 mg total) by mouth 2 (two) times daily.   ibuprofen 600 MG tablet Commonly known as:  ADVIL,MOTRIN Take 1 tablet (600 mg total) by mouth every 6 (six) hours.   ibuprofen 600 MG tablet Commonly known as:  ADVIL,MOTRIN Take 1 tablet (600 mg total) by mouth every 6 (six) hours as needed.   prenatal multivitamin Tabs tablet Take 1 tablet by mouth daily.      Follow-up Information    [facility] Follow up.   Specialty:  Obstetrics and Gynecology Why:  As needed for gyn care, return to MAU as needed for emergencies Contact information: [address] [place] [address] [phone]          Sharen Counter Certified Nurse-Midwife 09/24/2016  4:04 AM

## 2016-09-24 NOTE — MAU Note (Signed)
Pt returned after having a fever at home of 102. Here it is 100.5. Also having lightheadedness. Hasn't started new prescription for mastitis yet.

## 2016-09-24 NOTE — Discharge Instructions (Signed)
Mastitis  Mastitis is inflammation of the breast tissue. It occurs most often in women who are breastfeeding, but it can also affect other women, and even sometimes men.  What are the causes?  Mastitis is usually caused by a bacterial infection. Bacteria enter the breast tissue through cuts or openings in the skin. Typically, this occurs with breastfeeding because of cracked or irritated skin. Sometimes, it can occur even when there is no opening in the skin. It can be associated with plugged milk (lactiferous) ducts. Nipple piercing can also lead to mastitis. Also, some forms of breast cancer can cause mastitis.  What are the signs or symptoms?  · Swelling, redness, tenderness, and pain in an area of the breast.  · Swelling of the glands under the arm on the same side.  · Fever.  If an infection is allowed to progress, a collection of pus (abscess) may develop.  How is this diagnosed?  Your health care provider can usually diagnose mastitis based on your symptoms and a physical exam. Tests may be done to help confirm the diagnosis. These may include:  · Removal of pus from the breast by applying pressure to the area. This pus can be examined in the lab to determine which bacteria are present. If an abscess has developed, the fluid in the abscess can be removed with a needle. This can also be used to confirm the diagnosis and determine the bacteria present. In most cases, pus will not be present.  · Blood tests to determine if your body is fighting a bacterial infection.  · Mammogram or ultrasound tests to rule out other problems or diseases.    How is this treated?  Antibiotic medicine is used to treat a bacterial infection. Your health care provider will determine which bacteria are most likely causing the infection and will select an appropriate antibiotic. This is sometimes changed based on the results of tests performed to identify the bacteria, or if there is no response to the antibiotic selected. Antibiotics  are usually given by mouth. You may also be given medicine for pain.  Mastitis that occurs with breastfeeding will sometimes go away on its own, so your health care provider may choose to wait 24 hours after first seeing you to decide whether a prescription medicine is needed.  Follow these instructions at home:  · Only take over-the-counter or prescription medicines for pain, fever, or discomfort as directed by your health care provider.  · If your health care provider prescribed an antibiotic, take the medicine as directed. Make sure you finish it even if you start to feel better.  · Do not wear a tight or underwire bra. Wear a soft, supportive bra.  · Increase your fluid intake, especially if you have a fever.  · Women who are breastfeeding should follow these instructions:  ? Continue to empty the breast. Your health care provider can tell you whether this milk is safe for your infant or needs to be thrown out. You may be told to stop nursing until your health care provider thinks it is safe for your baby. Use a breast pump if you are advised to stop nursing.  ? Keep your nipples clean and dry.  ? Empty the first breast completely before going to the other breast. If your baby is not emptying your breasts completely for some reason, use a breast pump to empty your breasts.  ? If you go back to work, pump your breasts while at work to stay   in time with your nursing schedule.  ? Avoid allowing your breasts to become overly filled with milk (engorged).  Contact a health care provider if:  · You have pus-like discharge from the breast.  · Your symptoms do not improve with the treatment prescribed by your health care provider within 2 days.  Get help right away if:  · Your pain and swelling are getting worse.  · You have pain that is not controlled with medicine.  · You have a red line extending from the breast toward your armpit.  · You have a fever or persistent symptoms for more than 2-3 days.  · You have a fever  and your symptoms suddenly get worse.  This information is not intended to replace advice given to you by your health care provider. Make sure you discuss any questions you have with your health care provider.  Document Released: 02/02/2005 Document Revised: 07/11/2015 Document Reviewed: 09/02/2012  Elsevier Interactive Patient Education © 2017 [employer]

## 2016-09-26 LAB — BODY FLUID CULTURE
Culture: NORMAL
SPECIAL REQUESTS: NORMAL

## 2016-10-07 ENCOUNTER — Other Ambulatory Visit: Payer: Self-pay

## 2017-06-02 IMAGING — US US MFM OB FOLLOW-UP
1 series · 14 of 28 positions shown · non-contrast
Comparison: none

[Series 1: us mfm ob follow-up · 14 of 65 slices shown]
[im 3/65]
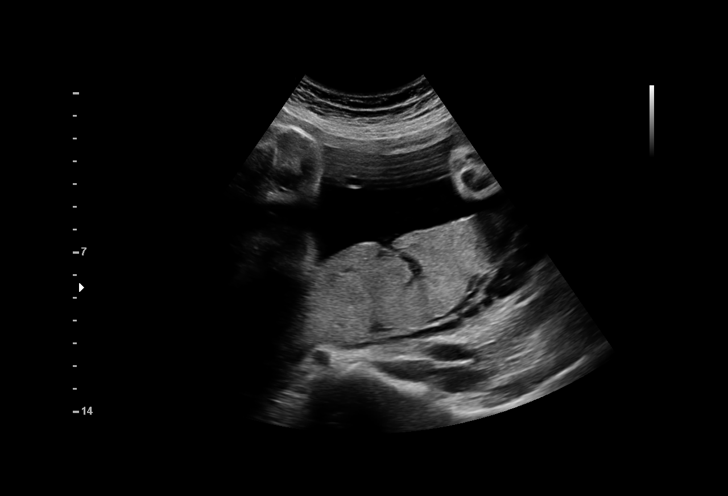
[im 8/65]
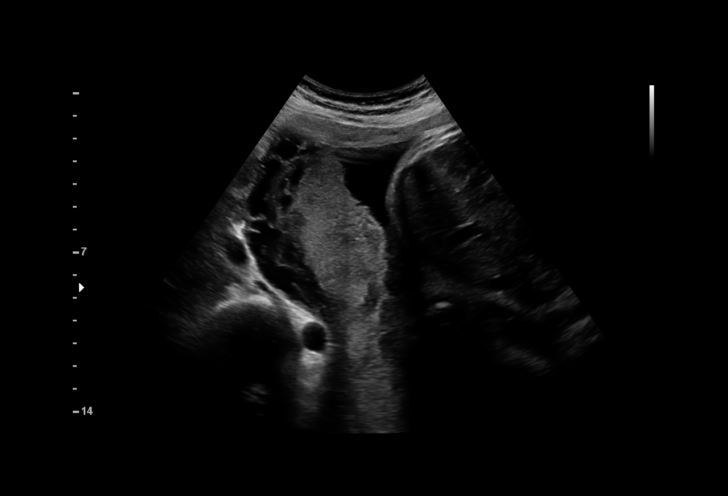
[im 12/65]
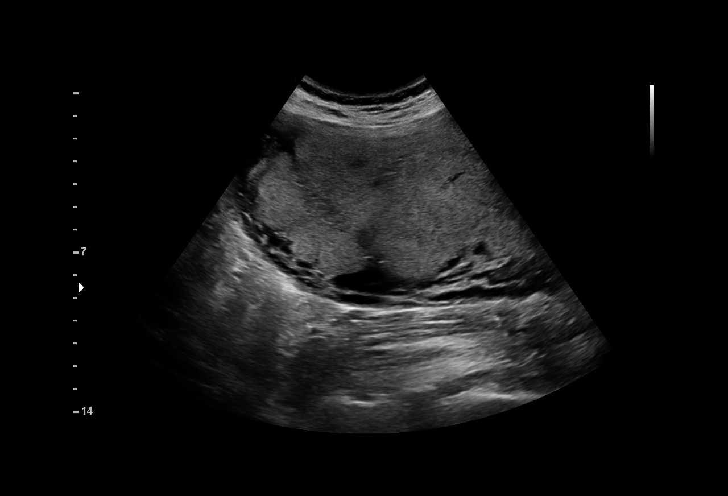
[im 17/65]
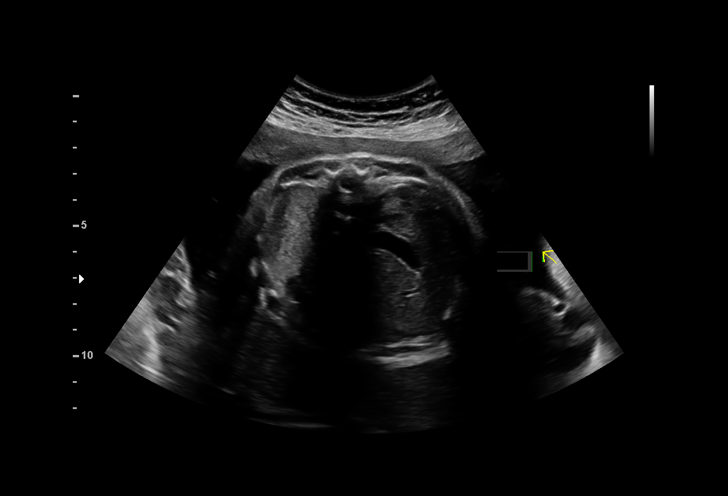
[im 22/65]
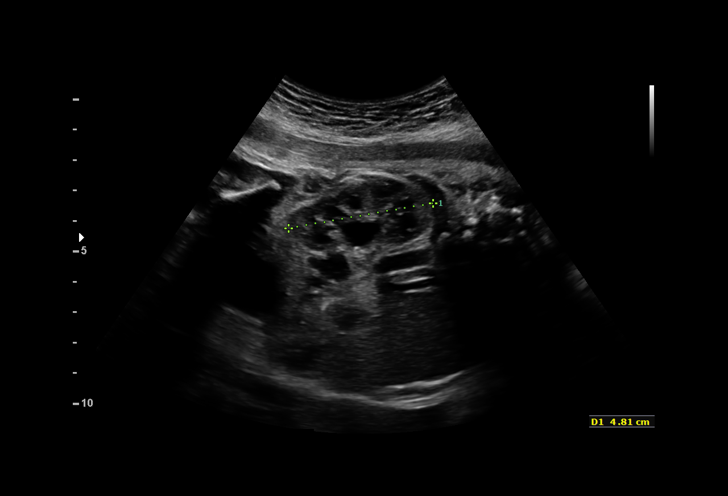
[im 27/65]
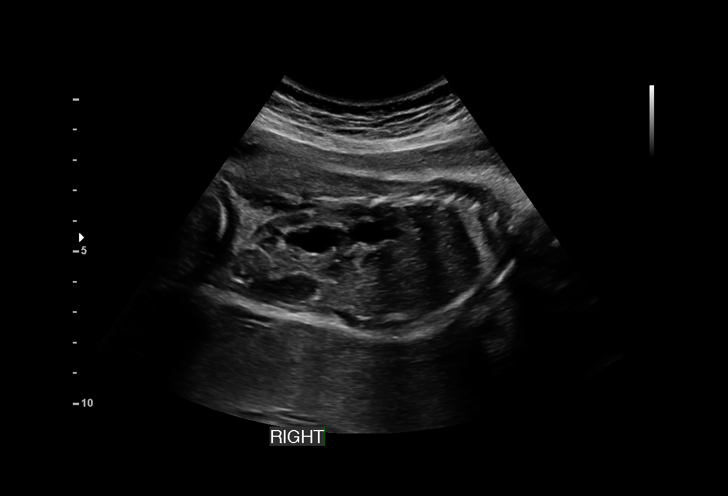
[im 31/65]
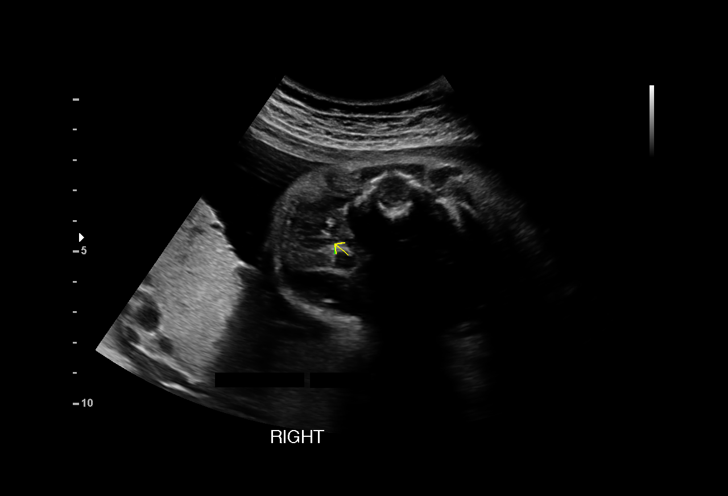
[im 36/65]
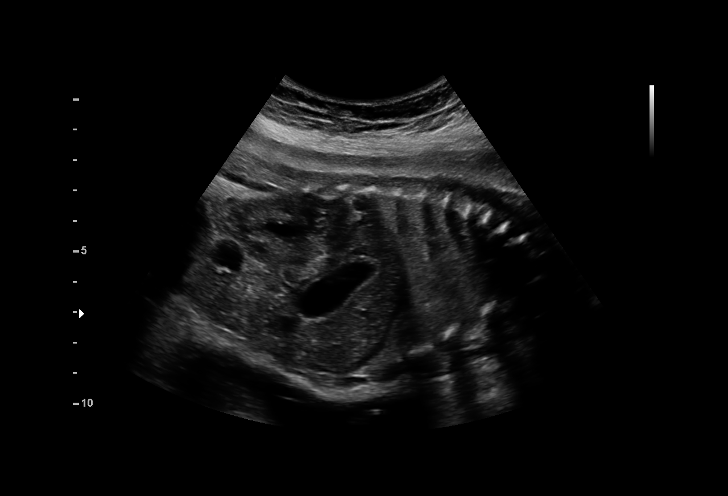
[im 41/65]
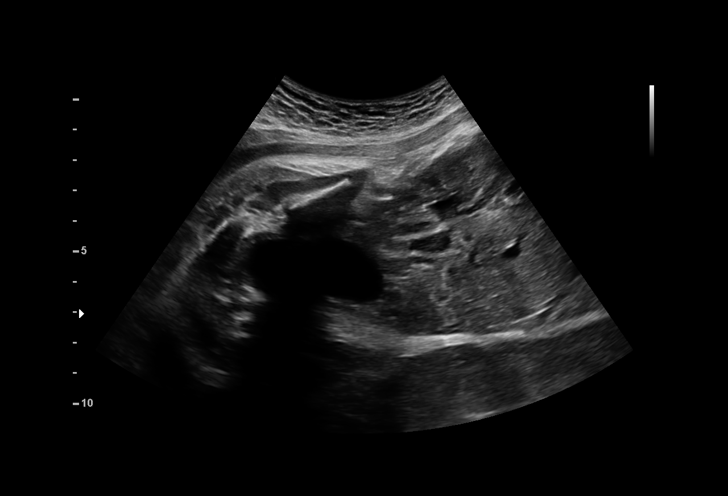
[im 46/65]
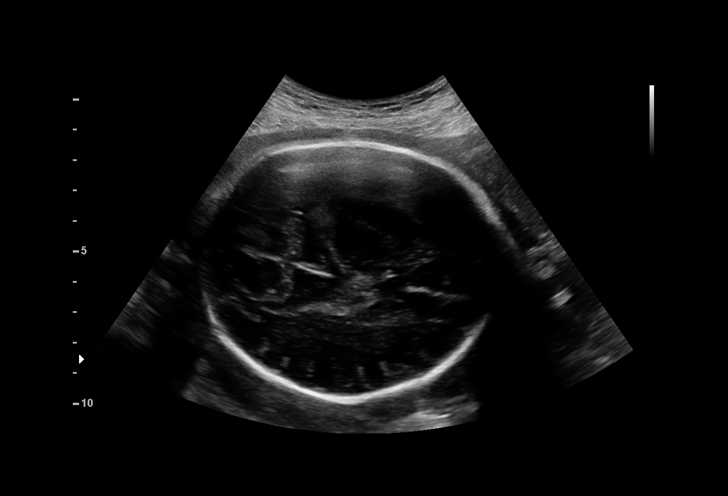
[im 50/65]
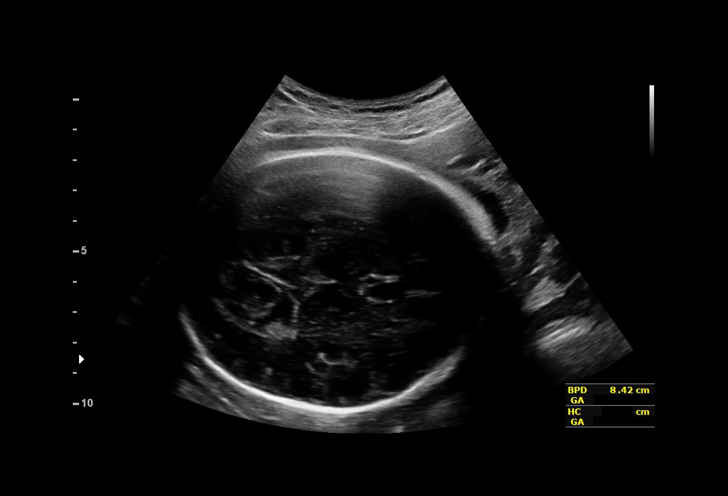
[im 55/65]
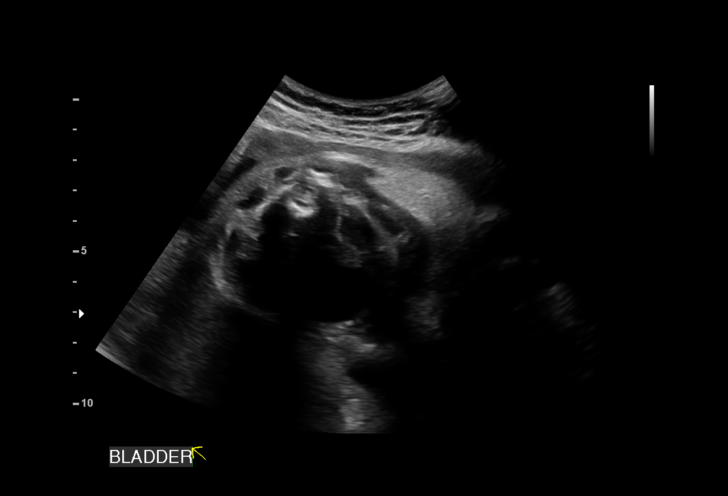
[im 60/65]
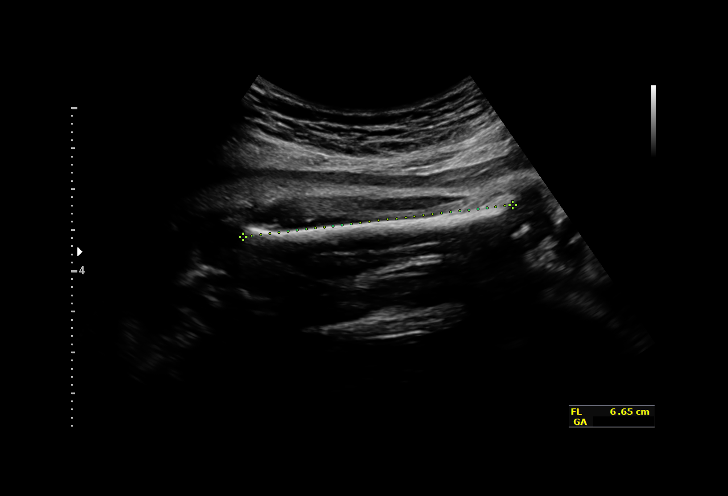
[im 65/65]
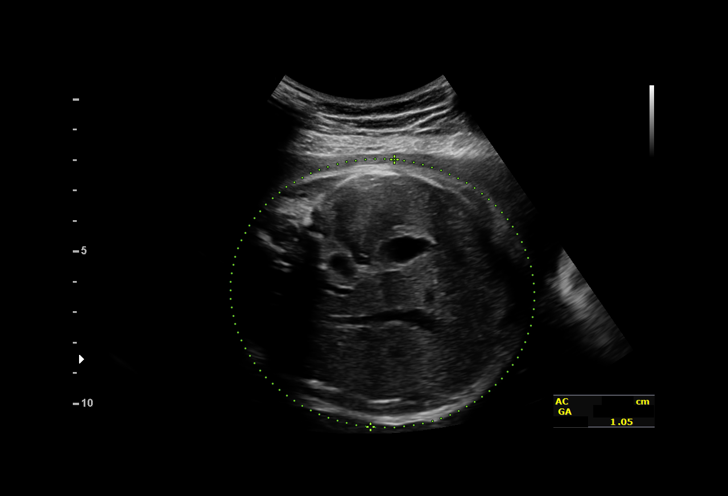

[14 of 28 positions shown; findings below may reference images not displayed]

[facility] [HOSPITAL]

1  EMILIA BAJWA            [id number]      [id number]     [id number]
Indications

33 weeks gestation of pregnancy
Fetal abnormality - other known or
suspected (bilateral renal pyelectasis)
OB History

Gravidity:    2         Term:   1        Prem:   0        SAB:   0
TOP:          0       Ectopic:  0        Living: 1
Fetal Evaluation

Num Of Fetuses:     1
Fetal Heart         129
Rate(bpm):
Cardiac Activity:   Observed
Presentation:       Cephalic
Placenta:           Posterior Fundal, above cervical os
P. Cord Insertion:  Previously Visualized

Amniotic Fluid
AFI FV:      Subjectively within normal limits

AFI Sum(cm)     %Tile       Largest Pocket(cm)
13.95           48

RUQ(cm)       RLQ(cm)       LUQ(cm)        LLQ(cm)
4.62
Biometry

BPD:      84.2  mm     G. Age:  33w 6d         48  %    CI:        75.71   %   70 - 86
FL/HC:      21.6   %   19.4 -
HC:      306.8  mm     G. Age:  34w 1d         23  %    HC/AC:      1.03       0.96 -
AC:      296.7  mm     G. Age:  33w 5d         47  %    FL/BPD:     78.9   %   71 - 87
FL:       66.4  mm     G. Age:  34w 1d         49  %    FL/AC:      22.4   %   20 - 24

Est. FW:    5541  gm      5 lb 1 oz     59  %
Gestational Age

LMP:           35w 2d       Date:   08/18/15                 EDD:   05/24/16
U/S Today:     34w 0d                                        EDD:   06/02/16
Best:          33w 6d    Det. By:   Early Ultrasound         EDD:   06/03/16
(11/07/15)
Anatomy

Cranium:               Appears normal         Aortic Arch:            Appears normal
Cavum:                 Appears normal         Ductal Arch:            Previously seen
Ventricles:            Appears normal         Diaphragm:              Appears normal
Choroid Plexus:        Previously seen        Stomach:                Appears normal, left
sided
Cerebellum:            Previously seen        Abdomen:                Appears normal
Posterior Fossa:       Previously seen        Abdominal Wall:         Previously seen
Nuchal Fold:           Not applicable (>20    Cord Vessels:           Appears normal (3
wks GA)                                        vessel cord)
Face:                  Orbits and profile     Kidneys:                Bilat pyelectasis,
previously seen
KtD.Fmm, 9tImm
Lips:                  Previously seen        Bladder:                Appears normal
Thoracic:              Appears normal         Spine:                  Previously seen
Heart:                 Previously seen        Upper Extremities:      Previously seen
RVOT:                  Previously seen        Lower Extremities:      Previously seen
LVOT:                  Previously seen

Other:  Heels and 5th digit previously visualized.
Cervix Uterus Adnexa

Cervix
Not visualized (advanced GA >83wks)
Impression

SIUP at 33+6 weeks
UTD A2 on right: possible duplicated collecting system with
upper pole pelvis measuring 1.2 cms (lower: WNL); UTD A1
in left: renal pelvis measured 8 mms; no ureters or
ureterocele identified; normal renal parenchyma
All other interval fetal anatomy was seen and appeared
normal; anatomic survey complete
Normal amniotic fluid volume
Appropriate interval growth with EFW at the 59th %tile

The US findings were shared with Ms. Parent. The
implications of a duplicated collecting system were discussed
in detail.
Recommendations

Routine prenatal care and delivery
Notify pediatric staff
Postnatal evaluation of newborn's kidneys
+/- Prophylactic antibiotics

## 2017-12-23 DIAGNOSIS — F4323 Adjustment disorder with mixed anxiety and depressed mood: Secondary | ICD-10-CM | POA: Diagnosis not present

## 2018-01-12 DIAGNOSIS — F4323 Adjustment disorder with mixed anxiety and depressed mood: Secondary | ICD-10-CM | POA: Diagnosis not present

## 2018-01-27 DIAGNOSIS — F4323 Adjustment disorder with mixed anxiety and depressed mood: Secondary | ICD-10-CM | POA: Diagnosis not present

## 2018-02-17 DIAGNOSIS — F4323 Adjustment disorder with mixed anxiety and depressed mood: Secondary | ICD-10-CM | POA: Diagnosis not present

## 2018-03-02 DIAGNOSIS — R7989 Other specified abnormal findings of blood chemistry: Secondary | ICD-10-CM | POA: Diagnosis not present

## 2018-03-02 DIAGNOSIS — R5383 Other fatigue: Secondary | ICD-10-CM | POA: Diagnosis not present

## 2018-03-02 DIAGNOSIS — E538 Deficiency of other specified B group vitamins: Secondary | ICD-10-CM | POA: Diagnosis not present

## 2018-03-02 DIAGNOSIS — M2550 Pain in unspecified joint: Secondary | ICD-10-CM | POA: Diagnosis not present

## 2018-03-03 DIAGNOSIS — F4323 Adjustment disorder with mixed anxiety and depressed mood: Secondary | ICD-10-CM | POA: Diagnosis not present

## 2018-03-14 DIAGNOSIS — M2550 Pain in unspecified joint: Secondary | ICD-10-CM | POA: Diagnosis not present

## 2018-03-14 DIAGNOSIS — M25531 Pain in right wrist: Secondary | ICD-10-CM | POA: Diagnosis not present

## 2018-03-14 DIAGNOSIS — G8929 Other chronic pain: Secondary | ICD-10-CM | POA: Diagnosis not present

## 2018-03-14 DIAGNOSIS — R7989 Other specified abnormal findings of blood chemistry: Secondary | ICD-10-CM | POA: Diagnosis not present

## 2018-03-21 DIAGNOSIS — F4323 Adjustment disorder with mixed anxiety and depressed mood: Secondary | ICD-10-CM | POA: Diagnosis not present

## 2018-04-07 DIAGNOSIS — F4323 Adjustment disorder with mixed anxiety and depressed mood: Secondary | ICD-10-CM | POA: Diagnosis not present

## 2018-04-21 DIAGNOSIS — F4323 Adjustment disorder with mixed anxiety and depressed mood: Secondary | ICD-10-CM | POA: Diagnosis not present

## 2018-12-07 DIAGNOSIS — Z6826 Body mass index (BMI) 26.0-26.9, adult: Secondary | ICD-10-CM | POA: Diagnosis not present

## 2018-12-07 DIAGNOSIS — Z124 Encounter for screening for malignant neoplasm of cervix: Secondary | ICD-10-CM | POA: Diagnosis not present

## 2018-12-07 DIAGNOSIS — Z1151 Encounter for screening for human papillomavirus (HPV): Secondary | ICD-10-CM | POA: Diagnosis not present

## 2018-12-07 DIAGNOSIS — Z01419 Encounter for gynecological examination (general) (routine) without abnormal findings: Secondary | ICD-10-CM | POA: Diagnosis not present

## 2019-01-10 ENCOUNTER — Other Ambulatory Visit: Payer: Self-pay

## 2019-01-10 DIAGNOSIS — Z20822 Contact with and (suspected) exposure to covid-19: Secondary | ICD-10-CM

## 2019-01-11 LAB — NOVEL CORONAVIRUS, NAA: SARS-CoV-2, NAA: NOT DETECTED

## 2019-02-28 DIAGNOSIS — F4323 Adjustment disorder with mixed anxiety and depressed mood: Secondary | ICD-10-CM | POA: Diagnosis not present

## 2019-03-14 DIAGNOSIS — F4323 Adjustment disorder with mixed anxiety and depressed mood: Secondary | ICD-10-CM | POA: Diagnosis not present

## 2019-03-28 DIAGNOSIS — F4323 Adjustment disorder with mixed anxiety and depressed mood: Secondary | ICD-10-CM | POA: Diagnosis not present

## 2019-04-11 DIAGNOSIS — F4323 Adjustment disorder with mixed anxiety and depressed mood: Secondary | ICD-10-CM | POA: Diagnosis not present

## 2019-04-25 DIAGNOSIS — F4323 Adjustment disorder with mixed anxiety and depressed mood: Secondary | ICD-10-CM | POA: Diagnosis not present

## 2019-05-16 DIAGNOSIS — F4323 Adjustment disorder with mixed anxiety and depressed mood: Secondary | ICD-10-CM | POA: Diagnosis not present

## 2019-06-01 DIAGNOSIS — F4323 Adjustment disorder with mixed anxiety and depressed mood: Secondary | ICD-10-CM | POA: Diagnosis not present

## 2019-06-05 DIAGNOSIS — R5383 Other fatigue: Secondary | ICD-10-CM | POA: Diagnosis not present

## 2019-06-05 DIAGNOSIS — R635 Abnormal weight gain: Secondary | ICD-10-CM | POA: Diagnosis not present

## 2019-06-05 DIAGNOSIS — R1310 Dysphagia, unspecified: Secondary | ICD-10-CM | POA: Diagnosis not present

## 2019-06-05 DIAGNOSIS — E063 Autoimmune thyroiditis: Secondary | ICD-10-CM | POA: Diagnosis not present

## 2019-06-15 DIAGNOSIS — F4323 Adjustment disorder with mixed anxiety and depressed mood: Secondary | ICD-10-CM | POA: Diagnosis not present

## 2019-06-29 DIAGNOSIS — F4323 Adjustment disorder with mixed anxiety and depressed mood: Secondary | ICD-10-CM | POA: Diagnosis not present

## 2019-10-06 DIAGNOSIS — D2371 Other benign neoplasm of skin of right lower limb, including hip: Secondary | ICD-10-CM | POA: Diagnosis not present

## 2020-08-22 ENCOUNTER — Ambulatory Visit (HOSPITAL_COMMUNITY)
Admission: EM | Admit: 2020-08-22 | Discharge: 2020-08-22 | Disposition: A | Discharge status: Home / Self Care | Payer: No Typology Code available for payment source | Attending: Family Medicine | Admitting: Family Medicine

## 2020-08-22 ENCOUNTER — Other Ambulatory Visit: Payer: Self-pay

## 2020-08-22 ENCOUNTER — Encounter (HOSPITAL_COMMUNITY): Payer: Self-pay | Admitting: Emergency Medicine

## 2020-08-22 DIAGNOSIS — J069 Acute upper respiratory infection, unspecified: Secondary | ICD-10-CM

## 2020-08-22 NOTE — ED Triage Notes (Signed)
6/27 started feeling unwell, sore throat, cough and congestion.  Reports negative at home test for covid.  Patient developed left eye redness.  Patient has prescription drops she is using in both eyes.  Several members in household have been sick with similar symptoms

## 2020-08-22 NOTE — ED Provider Notes (Signed)
  [facility]   [id number] 08/22/20 Arrival Time: 0904  ASSESSMENT & PLAN:  1. Viral upper respiratory tract infection    Lingering cough. No SOB reported. No signs of strep throat.  Discussed typical duration of viral illnesses. OTC symptom care as needed.   Follow-up Information     [facility].   Specialty: Urgent Care Why: As needed. Contact information: [address] [place] [address] [phone]                Reviewed expectations re: course of current medical issues. Questions answered. Outlined signs and symptoms indicating need for more acute intervention. Understanding verbalized. After Visit Summary given.   SUBJECTIVE: History from: patient. Valerie Haynes is a 30 y.o. female who reports URI symptoms; past 10 days. Children with similar. Sore throat. No fever. Lingering cough. Denies: fever. Normal PO intake without n/v/d.   OBJECTIVE:  Vitals:   08/22/20 1119  BP: 117/81  Pulse: 74  Resp: 18  Temp: 98.1 F (36.7 C)  TempSrc: Oral  SpO2: 97%    General appearance: alert; no distress Eyes: PERRLA; EOMI; conjunctiva normal HENT: [place]; AT; with mild nasal congestion; throat with cobblestoning Neck: supple  Lungs: speaks full sentences without difficulty; unlabored Extremities: no edema Skin: warm and dry Neurologic: normal gait Psychological: alert and cooperative; normal mood and affect   Allergies  Allergen Reactions   Milk-Related Compounds Anaphylaxis   Peanut-Containing Drug Products Anaphylaxis    Past Medical History:  Diagnosis Date   Chronic Epstein Barr virus (EBV) infection    Depression    Hashimoto's thyroiditis    IBS (irritable bowel syndrome)    Social History   Socioeconomic History   Marital status: Married    Spouse name: Not on file   Number of children: Not on file   Years of education: Not on file   Highest education level: Not on file  Occupational  History   Not on file  Tobacco Use   Smoking status: Former    Pack years: 0.00    Types: Cigarettes    Quit date: 01/27/2011    Years since quitting: 9.5   Smokeless tobacco: Former   Tobacco comments:    social smoking only before  Vaping Use   Vaping Use: Never used  Substance and Sexual Activity   Alcohol use: Yes    Comment: socially, rare 1/2 glass wine during pregnancy   Drug use: Yes    Types: Marijuana    Comment: history as teen   Sexual activity: Yes    Birth control/protection: None  Other Topics Concern   Not on file  Social History Narrative   Not on file   Social Determinants of Health   Financial Resource Strain: Not on file  Food Insecurity: Not on file  Transportation Needs: Not on file  Physical Activity: Not on file  Stress: Not on file  Social Connections: Not on file  Intimate Partner Violence: Not on file   Family History  Problem Relation Age of Onset   Rheum arthritis Father    Past Surgical History:  Procedure Laterality Date   NO PAST SURGERIES     WISDOM TOOTH EXTRACTION       Mardella Layman, MD 08/22/20 1327

## 2021-11-12 ENCOUNTER — Ambulatory Visit: Payer: No Typology Code available for payment source | Admitting: Allergy

## 2021-11-12 ENCOUNTER — Other Ambulatory Visit: Payer: Self-pay

## 2021-11-12 ENCOUNTER — Encounter: Payer: Self-pay | Admitting: Allergy

## 2021-11-12 VITALS — BP 116/68 | HR 87 | Temp 98.4°F | Resp 17 | Ht 67.25 in | Wt 175.0 lb

## 2021-11-12 DIAGNOSIS — J3089 Other allergic rhinitis: Secondary | ICD-10-CM | POA: Diagnosis not present

## 2021-11-12 DIAGNOSIS — L2089 Other atopic dermatitis: Secondary | ICD-10-CM

## 2021-11-12 DIAGNOSIS — T7800XD Anaphylactic reaction due to unspecified food, subsequent encounter: Secondary | ICD-10-CM

## 2021-11-12 DIAGNOSIS — H1013 Acute atopic conjunctivitis, bilateral: Secondary | ICD-10-CM

## 2021-11-12 NOTE — Patient Instructions (Signed)
-   continue avoidance of dairy and peanuts - have access to self-injectable epinephrine Epipen 0.3mg  at all times - follow emergency action plan in case of allergic reaction  - we have discussed the following in regards to foods:   Allergy: food allergy is when you have eaten a food, developed an allergic reaction after eating the food and have IgE to the food (positive food testing either by skin testing or blood testing).  Food allergy could lead to life threatening symptoms  Sensitivity: occurs when you have IgE to a food (positive food testing either by skin testing or blood testing) but is a food you eat without any issues.  This is not an allergy and we recommend keeping the food in the diet  Intolerance: this is when you have negative testing by either skin testing or blood testing thus not allergic but the food causes symptoms (like belly pain, bloating, diarrhea etc) with ingestion.  These foods should be avoided to prevent symptoms.    - you have sensitivity to the positive foods on your testing that you eat without issue thus continue to eat those foods in your diet  - Continue avoidance measures for cat, dog, dust mites, grass pollen, molds, tree pollen, weed pollen, mice - Avoidance measures provided. - Can take long-acting antihistamine like Zyrtec, Allegra or Xyzal daily as needed - For itchy/watery eyes can use Pataday 1 drop each eye daily as needed - For nasal congestion/drainage can use nasal spray like Flonase, Nasacort, Rhinocort daily as needed - You can use an extra dose of the antihistamine, if needed, for breakthrough symptoms.  - Consider nasal saline rinses 1-2 times daily to remove allergens from the nasal cavities as well as help with mucous clearance (this is especially helpful to do before the nasal sprays are given) - Consider allergy shots as a means of long-term control. - Allergy shots "re-train" and "reset" the immune system to ignore environmental allergens and  decrease the resulting immune response to those allergens (sneezing, itchy watery eyes, runny nose, nasal congestion, etc).    - Allergy shots improve symptoms in 75-85% of patients.  - Let us know if you would like to proceed with allergy shots  - moisturize skin after bathing to keep skin well hydrated  Follow-up in 1 year or sooner if needed

## 2021-11-12 NOTE — Progress Notes (Signed)
New Patient Note  RE: Valerie Haynes MRN: [id number] DOB: 12-18-1990 Date of Office Visit: 11/12/2021  Primary care provider: Irena Reichmann, DO  Chief Complaint: Food allergy  History of present illness: Valerie Haynes is a 31 y.o. female presenting today for evaluation of food allergy.  She saw an allergist at [facility] in July for her food allergy history.  This allergist however is no longer a part of the [facility] thus she needed another allergist.  She had blood work done with them that revealed the following and KU/liter: Alpha lactalbumin 0.13, beta lactoglobulin 0.15, casein 0.1, cheddar cheese type 0.16, mold type cheese 0.17, hazelnut 1.32, [place] nut 0.53, almond 1.55, pecan 0.76, cashew 0.16, walnut 1.26, milk 0.17, wheat 1.91, corn 1.35, peanut 1.87, soybean 1.35, pork 0.16, beef 0.19, seafood mix positive, strawberry 1.27, blueberries 0.54, raspberry 1.18; negative to egg, chocolates as well as peanut components. Environmental allergy testing showed dust mite DP 0.17, dust mite DF 0.49, cat dander 6.01 with positive components, dog dander 1.49 with positive can F-3, [place] 20.2, Timothy 71.4, [place] 22.9, cockroach 1.39, maple/[place] 1.55, common silver birch 1.41, cedar 1.38, oak 1.56, elm/american 2.37, [place] 1.56, pecan/hickory 1.71, white mulberry 1.27, short ragweed 124, pigweed 1.69, sheep Sorrell 1.57, mouse urine 0.87, Mucor racemosus 0.25, Epicoccum 0.11, stemphylium 0.11.  She saw an allergist as a teen after having ice cream and states her throat felt like it was tightening and closing.  The ice cream was Reeses cup ice cream.  She tested positive to milk and peanut.  She has avoided peanut products since.  She states she can eat mozzarella, parmesan and provolone cheese is without issue.  She does avoid straight dairy like milk and ice cream.She states whey powder in a food item caused her throat to feel tight.   She will take benadryl if this  occurs.  She had an accidetnal dairy intake in June with sour cream that she thought was vegan but wasn't and she had throat tightening.  She took benadryl.  She can eat baked milk products.   She can eat tree nuts without issue.  She does not eat wheat due to hashimotos.  She has a child with alpha gal allergy thus the family does not eat red meat products. She has a current up-to-date epipen.    She does report seasonal and year-round allergy symptoms with itchy, watery eyes, runny and stuffy nose as well as sneezing.  She mostly will do allergen avoidance is her way to manage her symptoms.  She hated using nasal sprays.  She would take Benadryl if needed.  No history of eczema but states she can have rough, itchy patches that can come and go.  Can appear on her arm crease.  She would just moisturize.   No history of asthma.    Review of systems in the past 4 weeks: Review of Systems  Constitutional: Negative.   HENT: Negative.    Eyes: Negative.   Respiratory: Negative.    Cardiovascular: Negative.   Gastrointestinal: Negative.   Musculoskeletal: Negative.   Skin: Negative.   Allergic/Immunologic: Negative.   Neurological: Negative.     All other systems negative unless noted above in HPI  Past medical history: Past Medical History:  Diagnosis Date   Chronic Epstein Barr virus (EBV) infection    Depression    Hashimoto's thyroiditis    IBS (irritable bowel syndrome)     Past surgical history: Past Surgical History:  Procedure Laterality Date   NO PAST SURGERIES     WISDOM TOOTH EXTRACTION      Family history:  Family History  Problem Relation Age of Onset   Allergic rhinitis Father    Asthma Father    Rheum arthritis Father    Allergic rhinitis Brother    Asthma Brother     Social history: Lives in a home with out carpeting with 2 poodles, 2 reptiles and 1 fish in the home.  There are chickens outside the home.  There is no concern for water damage, mildew or  roaches in the home.  She is a Dietitian.  She denies smoking history.   Medication List: Current Outpatient Medications  Medication Sig Dispense Refill   EPINEPHrine 0.3 mg/0.3 mL IJ SOAJ injection SMARTSIG:IM As Directed PRN     VYVANSE 10 MG capsule Take 10 mg by mouth daily.     acidophilus (RISAQUAD) CAPS capsule Take 1 capsule by mouth daily. (Patient not taking: Reported on 11/12/2021)     dicloxacillin (DYNAPEN) 500 MG capsule Take 1 capsule (500 mg total) by mouth 4 (four) times daily. (Patient not taking: Reported on 11/12/2021) 28 capsule 0   docusate sodium (COLACE) 100 MG capsule Take 1 capsule (100 mg total) by mouth 2 (two) times daily. (Patient not taking: Reported on 07/20/2016) 30 capsule 0   ibuprofen (ADVIL,MOTRIN) 600 MG tablet Take 1 tablet (600 mg total) by mouth every 6 (six) hours. (Patient not taking: Reported on 07/20/2016) 30 tablet 0   ibuprofen (ADVIL,MOTRIN) 600 MG tablet Take 1 tablet (600 mg total) by mouth every 6 (six) hours as needed. (Patient not taking: Reported on 11/12/2021) 30 tablet 1   moxifloxacin (VIGAMOX) 0.5 % ophthalmic solution 1 drop 3 (three) times daily. Belongs to a family member (Patient not taking: Reported on 11/12/2021)     Prenatal Vit-Fe Fumarate-FA (PRENATAL MULTIVITAMIN) TABS tablet Take 1 tablet by mouth daily. (Patient not taking: Reported on 11/12/2021)     No current facility-administered medications for this visit.    Known medication allergies: Allergies  Allergen Reactions   Milk-Related Compounds Anaphylaxis   Peanut-Containing Drug Products Anaphylaxis     Physical examination: Blood pressure 116/68, pulse 87, temperature 98.4 F (36.9 C), temperature source Temporal, resp. rate 17, height 5' 7.25" (1.708 m), weight 175 lb (79.4 kg), SpO2 100 %, currently breastfeeding.  General: Alert, interactive, in no acute distress. HEENT: PERRLA, TMs pearly gray, turbinates non-edematous without discharge,  post-pharynx non erythematous. Neck: Supple without lymphadenopathy. Lungs: Clear to auscultation without wheezing, rhonchi or rales. {no increased work of breathing. CV: Normal S1, S2 without murmurs. Abdomen: Nondistended, nontender. Skin: Warm and dry, without lesions or rashes. Extremities:  No clubbing, cyanosis or edema. Neuro:   Grossly intact.  Diagnositics/Labs: Labs: See HPI  Assessment and plan: Anaphylaxis due to food  - continue avoidance of dairy and peanuts - have access to self-injectable epinephrine Epipen 0.3mg  at all times - follow emergency action plan in case of allergic reaction  - we have discussed the following in regards to foods:   Allergy: food allergy is when you have eaten a food, developed an allergic reaction after eating the food and have IgE to the food (positive food testing either by skin testing or blood testing).  Food allergy could lead to life threatening symptoms  Sensitivity: occurs when you have IgE to a food (positive food testing either by skin testing or blood testing) but is a food  you eat without any issues.  This is not an allergy and we recommend keeping the food in the diet  Intolerance: this is when you have negative testing by either skin testing or blood testing thus not allergic but the food causes symptoms (like belly pain, bloating, diarrhea etc) with ingestion.  These foods should be avoided to prevent symptoms.    - you have sensitivity to the positive foods on your testing that you eat without issue thus continue to eat those foods in your diet  Allergic rhinitis with conjunctivitis - Continue avoidance measures for cat, dog, dust mites, grass pollen, molds, tree pollen, weed pollen, mice - Avoidance measures provided. - Can take long-acting antihistamine like Zyrtec, Allegra or Xyzal daily as needed - For itchy/watery eyes can use Pataday 1 drop each eye daily as needed - For nasal congestion/drainage can use nasal spray like  Flonase, Nasacort, Rhinocort daily as needed - You can use an extra dose of the antihistamine, if needed, for breakthrough symptoms.  - Consider nasal saline rinses 1-2 times daily to remove allergens from the nasal cavities as well as help with mucous clearance (this is especially helpful to do before the nasal sprays are given) - Consider allergy shots as a means of long-term control. - Allergy shots "re-train" and "reset" the immune system to ignore environmental allergens and decrease the resulting immune response to those allergens (sneezing, itchy watery eyes, runny nose, nasal congestion, etc).    - Allergy shots improve symptoms in 75-85% of patients.  - Let us know if you would like to proceed with allergy shots  Eczema - moisturize skin after bathing to keep skin well hydrated  Follow-up in 1 year or sooner if needed  I appreciate the opportunity to take part in [facility] care. Please do not hesitate to contact me with questions.  Sincerely,   Prudy Feeler, MD Allergy/Immunology [facility] [place] [facility] [place]

## 2021-11-25 NOTE — Telephone Encounter (Signed)
error 

## 2022-03-06 DIAGNOSIS — F902 Attention-deficit hyperactivity disorder, combined type: Secondary | ICD-10-CM | POA: Diagnosis not present

## 2022-03-06 DIAGNOSIS — Z79899 Other long term (current) drug therapy: Secondary | ICD-10-CM | POA: Diagnosis not present

## 2022-03-10 DIAGNOSIS — F900 Attention-deficit hyperactivity disorder, predominantly inattentive type: Secondary | ICD-10-CM | POA: Diagnosis not present

## 2022-03-10 DIAGNOSIS — F902 Attention-deficit hyperactivity disorder, combined type: Secondary | ICD-10-CM | POA: Diagnosis not present

## 2022-03-16 DIAGNOSIS — Z130 Encounter for screening for diseases of the blood and blood-forming organs and certain disorders involving the immune mechanism: Secondary | ICD-10-CM | POA: Diagnosis not present

## 2022-03-16 DIAGNOSIS — Z124 Encounter for screening for malignant neoplasm of cervix: Secondary | ICD-10-CM | POA: Diagnosis not present

## 2022-03-16 DIAGNOSIS — N898 Other specified noninflammatory disorders of vagina: Secondary | ICD-10-CM | POA: Diagnosis not present

## 2022-03-16 DIAGNOSIS — Z01419 Encounter for gynecological examination (general) (routine) without abnormal findings: Secondary | ICD-10-CM | POA: Diagnosis not present

## 2022-03-16 DIAGNOSIS — N9089 Other specified noninflammatory disorders of vulva and perineum: Secondary | ICD-10-CM | POA: Diagnosis not present

## 2022-03-16 DIAGNOSIS — Z1151 Encounter for screening for human papillomavirus (HPV): Secondary | ICD-10-CM | POA: Diagnosis not present

## 2022-03-31 DIAGNOSIS — F4323 Adjustment disorder with mixed anxiety and depressed mood: Secondary | ICD-10-CM | POA: Diagnosis not present

## 2022-04-08 DIAGNOSIS — F4323 Adjustment disorder with mixed anxiety and depressed mood: Secondary | ICD-10-CM | POA: Diagnosis not present

## 2022-04-14 DIAGNOSIS — F4323 Adjustment disorder with mixed anxiety and depressed mood: Secondary | ICD-10-CM | POA: Diagnosis not present

## 2022-04-28 DIAGNOSIS — L309 Dermatitis, unspecified: Secondary | ICD-10-CM | POA: Diagnosis not present

## 2022-05-12 DIAGNOSIS — F4323 Adjustment disorder with mixed anxiety and depressed mood: Secondary | ICD-10-CM | POA: Diagnosis not present

## 2022-05-26 DIAGNOSIS — F4323 Adjustment disorder with mixed anxiety and depressed mood: Secondary | ICD-10-CM | POA: Diagnosis not present

## 2022-06-05 DIAGNOSIS — F902 Attention-deficit hyperactivity disorder, combined type: Secondary | ICD-10-CM | POA: Diagnosis not present

## 2022-06-05 DIAGNOSIS — Z79899 Other long term (current) drug therapy: Secondary | ICD-10-CM | POA: Diagnosis not present

## 2022-06-10 DIAGNOSIS — F4323 Adjustment disorder with mixed anxiety and depressed mood: Secondary | ICD-10-CM | POA: Diagnosis not present

## 2022-06-19 DIAGNOSIS — M25569 Pain in unspecified knee: Secondary | ICD-10-CM | POA: Diagnosis not present

## 2022-06-19 DIAGNOSIS — S80219A Abrasion, unspecified knee, initial encounter: Secondary | ICD-10-CM | POA: Diagnosis not present

## 2022-06-19 DIAGNOSIS — S8990XA Unspecified injury of unspecified lower leg, initial encounter: Secondary | ICD-10-CM | POA: Diagnosis not present

## 2022-07-01 DIAGNOSIS — F4323 Adjustment disorder with mixed anxiety and depressed mood: Secondary | ICD-10-CM | POA: Diagnosis not present

## 2022-07-03 DIAGNOSIS — S8990XA Unspecified injury of unspecified lower leg, initial encounter: Secondary | ICD-10-CM | POA: Diagnosis not present

## 2022-07-15 DIAGNOSIS — F4323 Adjustment disorder with mixed anxiety and depressed mood: Secondary | ICD-10-CM | POA: Diagnosis not present

## 2022-08-05 DIAGNOSIS — F4323 Adjustment disorder with mixed anxiety and depressed mood: Secondary | ICD-10-CM | POA: Diagnosis not present

## 2022-09-01 DIAGNOSIS — F4323 Adjustment disorder with mixed anxiety and depressed mood: Secondary | ICD-10-CM | POA: Diagnosis not present

## 2022-09-02 DIAGNOSIS — Z13220 Encounter for screening for lipoid disorders: Secondary | ICD-10-CM | POA: Diagnosis not present

## 2022-09-02 DIAGNOSIS — R7309 Other abnormal glucose: Secondary | ICD-10-CM | POA: Diagnosis not present

## 2022-09-02 DIAGNOSIS — Z79899 Other long term (current) drug therapy: Secondary | ICD-10-CM | POA: Diagnosis not present

## 2022-09-02 DIAGNOSIS — Z8639 Personal history of other endocrine, nutritional and metabolic disease: Secondary | ICD-10-CM | POA: Diagnosis not present

## 2022-09-03 DIAGNOSIS — Z79899 Other long term (current) drug therapy: Secondary | ICD-10-CM | POA: Diagnosis not present

## 2022-09-03 DIAGNOSIS — F902 Attention-deficit hyperactivity disorder, combined type: Secondary | ICD-10-CM | POA: Diagnosis not present

## 2022-09-15 DIAGNOSIS — F4323 Adjustment disorder with mixed anxiety and depressed mood: Secondary | ICD-10-CM | POA: Diagnosis not present

## 2022-09-22 DIAGNOSIS — Z79899 Other long term (current) drug therapy: Secondary | ICD-10-CM | POA: Diagnosis not present

## 2022-09-22 DIAGNOSIS — Z0000 Encounter for general adult medical examination without abnormal findings: Secondary | ICD-10-CM | POA: Diagnosis not present

## 2022-09-22 DIAGNOSIS — Z8639 Personal history of other endocrine, nutritional and metabolic disease: Secondary | ICD-10-CM | POA: Diagnosis not present

## 2022-09-22 DIAGNOSIS — R7309 Other abnormal glucose: Secondary | ICD-10-CM | POA: Diagnosis not present

## 2022-10-06 DIAGNOSIS — F4323 Adjustment disorder with mixed anxiety and depressed mood: Secondary | ICD-10-CM | POA: Diagnosis not present

## 2022-10-28 DIAGNOSIS — K6289 Other specified diseases of anus and rectum: Secondary | ICD-10-CM | POA: Diagnosis not present

## 2022-10-28 DIAGNOSIS — K625 Hemorrhage of anus and rectum: Secondary | ICD-10-CM | POA: Diagnosis not present

## 2022-10-28 DIAGNOSIS — K600 Acute anal fissure: Secondary | ICD-10-CM | POA: Diagnosis not present

## 2023-08-23 ENCOUNTER — Ambulatory Visit: Payer: Self-pay | Admitting: General Surgery

## 2023-08-23 NOTE — H&P (Signed)
 REFERRING PHYSICIAN:  Gerome Lonell BROCKS, DO  PROVIDER:  BERNARDA WANDA NED, MD  MRN: [id number] DOB: 02-27-1990 DATE OF ENCOUNTER: 08/23/2023  Subjective   Chief Complaint: No chief complaint on file.     History of Present Illness:  .  33 year old female status post I&D of a perirectal abscess in early April 2025.  She continues to have some swelling and drainage from the area.  She is here today for further evaluation.  She is currently having minimal drainage but did have some yellowish drainage approximately 1 week ago.  Past Medical History:  Diagnosis Date   Chronic Epstein Barr virus (EBV) infection    Depression    Hashimoto's thyroiditis    IBS (irritable bowel syndrome)     Past Surgical History:  Procedure Laterality Date   NO PAST SURGERIES     WISDOM TOOTH EXTRACTION      Family History  Problem Relation Age of Onset   Allergic rhinitis Father    Asthma Father    Rheum arthritis Father    Allergic rhinitis Brother    Asthma Brother     Social History   Socioeconomic History   Marital status: Married    Spouse name: Not on file   Number of children: Not on file   Years of education: Not on file   Highest education level: Not on file  Occupational History   Not on file  Tobacco Use   Smoking status: Former    Current packs/day: 0.00    Types: Cigarettes    Quit date: 01/27/2011    Years since quitting: 12.5    Passive exposure: Past   Smokeless tobacco: Former   Tobacco comments:    social smoking only before  Vaping Use   Vaping status: Never Used  Substance and Sexual Activity   Alcohol use: Yes    Comment: socially, rare 1/2 glass wine during pregnancy   Drug use: Yes    Types: Marijuana    Comment: history as teen   Sexual activity: Yes    Birth control/protection: None  Other Topics Concern   Not on file  Social History Narrative   Not on file   Social Drivers of Health   Financial Resource Strain: Not on file  Food  Insecurity: Not on file  Transportation Needs: Not on file  Physical Activity: Not on file  Stress: Not on file  Social Connections: Not on file  Intimate Partner Violence: Not on file     Current Outpatient Medications:    EPINEPHrine 0.3 mg/0.3 mL IJ SOAJ injection, SMARTSIG:IM As Directed PRN, Disp: , Rfl:    VYVANSE 10 MG capsule, Take 10 mg by mouth daily., Disp: , Rfl:   Allergies  Allergen Reactions   Milk-Related Compounds Anaphylaxis   Peanut-Containing Drug Products Anaphylaxis    Review of Systems - Negative except as stated above    Objective:    Vitals:   08/23/23 1449  PainSc: 0-No pain      Exam Gen: NAD Rectal: Small L posterior punctate lesion that is currently draining purulence.    Labs, Imaging and Diagnostic Testing: Previous office notes reviewed.  Assessment and Plan:  Diagnoses and all orders for this visit:  Anal fistula   Patient appears to have a healing I&D site.  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  I have recommended EUA with fistulotomy vs seton placement.   We discussed the typical indications and recovery times for both. All questions answered.  Risks include bleeding and pain.       Bernarda JAYSON Ned, MD Colon and Rectal Surgery [facility]

## 2023-10-10 NOTE — Progress Notes (Incomplete)
 The patient was identified using 2 approved identifiers. All issues noted in this document were discussed and addressed, Ms Valerie Haynes voiced understanding and agreement with all preoperative instructions. The patient was emailed the surgery instructions per her request.  [email or web address]    The patient was instructed to call our Pharmacy [phone]) and the Admitting Office [phone] or [phone]) to complete their Pre-surgical Interview.    COVID Vaccine received:  []  No [x]  Yes Date of any COVID positive Test in last 90 days: none  PCP - Lonell Collet, DO   [phone] Cardiologist - none  Chest x-ray -  EKG - no history to warrant Stress Test -  ECHO -  Cardiac Cath -  CT Coronary Calcium score:   Bowel Prep - []  No  []   Yes __patient to call, no info at PST____  Pacemaker / ICD device [x]  No []  Yes   Spinal Cord Stimulator:[x]  No []  Yes       History of Sleep Apnea? [x]  No []  Yes   CPAP used?- [x]  No []  Yes    Patient has: [x]  NO Hx DM   []  Pre-DM   []  DM1  []   DM2 Does the patient monitor blood sugar?   [x]  N/A   []  No []  Yes  Last A1c was:  5.6 normal on  10-06-23     Blood Thinner / Instructions: None Aspirin Instructions:  none  ERAS Protocol Ordered: [x]  No  []  Yes Patient is to be NPO after: Midnight Prior  Dental hx: []  Dentures:  [x]  N/A      []  Bridge or Partial:                   []  Loose or Damaged teeth:   Activity level: Able to walk up 2 flights of stairs without becoming significantly short of breath or having chest pain?  []  No   [x]    Yes  Patient can perform ADLs without assistance. []  No   [x]   Yes  Anesthesia review: ADD, Hashimoto's Thyroiditis, depression, Chronic Epstein Barre Syn- dx 10-15 years ago.   Patient denies any S&S of respiratory illness or Covid - no shortness of breath, fever, cough or chest pain at PAT appointment.  Patient verbalized understanding and agreement to the Pre-Surgical Instructions that were  given to them at this PAT appointment. Patient was also educated of the need to review these PAT instructions again prior to her surgery.I reviewed the appropriate phone numbers to call if they have any and questions or concerns.

## 2023-10-10 NOTE — Patient Instructions (Signed)
 SURGICAL WAITING ROOM VISITATION Patients having surgery or a procedure may have no more than 2 support people in the waiting area - these visitors may rotate in the visitor waiting room.   If the patient needs to stay at the hospital during part of their recovery, the visitor guidelines for inpatient rooms apply.  PRE-OP VISITATION  Pre-op nurse will coordinate an appropriate time for 1 support person to accompany the patient in pre-op.  This support person may not rotate.  This visitor will be contacted when the time is appropriate for the visitor to come back in the pre-op area.  Please refer to the [facility] website for the visitor guidelines for Inpatients (after your surgery is over and you are in a regular room).  You are not required to quarantine at this time prior to your surgery. However, you must do this: Hand Hygiene often Do NOT share personal items Notify your provider if you are in close contact with someone who has COVID or you develop fever 100.4 or greater, new onset of sneezing, cough, sore throat, shortness of breath or body aches.  If you test positive for Covid or have been in contact with anyone that has tested positive in the last 10 days please notify you surgeon.    Your procedure is scheduled on:  Wednesday  October 20, 2023  Report to [facility] Main Entrance: [address] entrance where the [employer] is available.   Report to admitting at:  07:45   AM  Call this number if you have any questions or problems the morning of surgery [phone]  DO NOT EAT OR DRINK ANYTHING AFTER MIDNIGHT THE NIGHT PRIOR TO YOUR SURGERY / PROCEDURE.   FOLLOW  ANY ADDITIONAL PRE OP INSTRUCTIONS YOU RECEIVED FROM YOUR SURGEON'S OFFICE!!!   Oral Hygiene is also important to reduce your risk of infection.        Remember - BRUSH YOUR TEETH THE MORNING OF SURGERY WITH YOUR REGULAR TOOTHPASTE  Do NOT smoke after Midnight the night before surgery.  STOP TAKING all  Vitamins, Herbs and supplements 1 week before your surgery.   Take ONLY these medicines the morning of surgery with A SIP OF WATER: Tylenol  if needed for pain.   DO NOT TAKE VYVANSE the morning of your surgery.                    You may not have any metal on your body including hair pins, jewelry, and body piercing  Do not wear make-up, lotions, powders, perfumes or deodorant  Do not wear nail polish including gel and S&S, artificial / acrylic nails, or any other type of covering on natural nails including finger and toenails. If you have artificial nails, gel coating, etc., that needs to be removed by a nail salon, Please have this removed prior to surgery. Not doing so may mean that your surgery could be cancelled or delayed if the Surgeon or anesthesia staff feels like they are unable to monitor you safely.   Do not shave 48 hours prior to surgery to avoid nicks in your skin which may contribute to postoperative infections.   Contacts, Hearing Aids, dentures or bridgework may not be worn into surgery. DENTURES WILL BE REMOVED PRIOR TO SURGERY PLEASE DO NOT APPLY Poly grip OR ADHESIVES!!!  Patients discharged on the day of surgery will not be allowed to drive home.  Someone NEEDS to stay with you for the first 24 hours after anesthesia.  Do not bring your home medications to the hospital. The Pharmacy will dispense medications listed on your medication list to you during your admission in the Hospital.  Please read over the following fact sheets you were given: IF YOU HAVE QUESTIONS ABOUT YOUR PRE-OP INSTRUCTIONS, PLEASE CALL [phone].   [facility] - Preparing for Surgery Before surgery, you can play an important role.  Because skin is not sterile, your skin needs to be as free of germs as possible.  You can reduce the number of germs on your skin by washing with CHG (chlorahexidine gluconate) soap before surgery.  CHG is an antiseptic cleaner which kills germs and bonds with the  skin to continue killing germs even after washing. Please DO NOT use if you have an allergy to CHG or antibacterial soaps.  If your skin becomes reddened/irritated stop using the CHG and inform your nurse when you arrive at Short Stay. Do not shave (including legs and underarms) for at least 48 hours prior to the first CHG shower.  You may shave your face/neck.  Please follow these instructions carefully:  1.  Shower with CHG Soap the night before surgery and the  morning of surgery.  2.  If you choose to wash your hair, wash your hair first as usual with your normal  shampoo.  3.  After you shampoo, rinse your hair and body thoroughly to remove the shampoo.                             4.  Use CHG as you would any other liquid soap.  You can apply chg directly to the skin and wash.  Gently with a scrungie or clean washcloth.  5.  Apply the CHG Soap to your body ONLY FROM THE NECK DOWN.   Do not use on face/ open                           Wound or open sores. Avoid contact with eyes, ears mouth and genitals (private parts).                       Wash face,  Genitals (private parts) with your normal soap.             6.  Wash thoroughly, paying special attention to the area where your  surgery  will be performed.  7.  Thoroughly rinse your body with warm water from the neck down.  8.  DO NOT shower/wash with your normal soap after using and rinsing off the CHG Soap.            9.  Pat yourself dry with a clean towel.            10.  Wear clean pajamas.            11.  Place clean sheets on your bed the night of your first shower and do not  sleep with pets.  ON THE DAY OF SURGERY : Do not apply any lotions/deodorants the morning of surgery.  Please wear clean clothes to the hospital/surgery center.    FAILURE TO FOLLOW THESE INSTRUCTIONS MAY RESULT IN THE CANCELLATION OF YOUR SURGERY  PATIENT SIGNATURE_________________________________  NURSE  SIGNATURE__________________________________  ________________________________________________________________________

## 2023-10-12 ENCOUNTER — Encounter (HOSPITAL_COMMUNITY): Payer: Self-pay

## 2023-10-12 ENCOUNTER — Encounter (HOSPITAL_COMMUNITY)
Admission: RE | Admit: 2023-10-12 | Discharge: 2023-10-12 | Disposition: A | Discharge status: Home / Self Care | Source: Ambulatory Visit | Attending: General Surgery | Admitting: General Surgery

## 2023-10-12 VITALS — Ht 67.0 in | Wt 142.0 lb

## 2023-10-12 DIAGNOSIS — K603 Anal fistula, unspecified: Secondary | ICD-10-CM

## 2023-10-12 DIAGNOSIS — Z01818 Encounter for other preprocedural examination: Secondary | ICD-10-CM

## 2023-10-12 HISTORY — DX: Other specified health status: Z78.9

## 2023-10-12 HISTORY — DX: Attention-deficit hyperactivity disorder, unspecified type: F90.9

## 2023-10-12 HISTORY — DX: Gastro-esophageal reflux disease without esophagitis: K21.9

## 2023-10-20 ENCOUNTER — Ambulatory Visit (HOSPITAL_COMMUNITY): Payer: Self-pay | Admitting: Certified Registered"

## 2023-10-20 ENCOUNTER — Encounter (HOSPITAL_COMMUNITY)
Admission: RE | Disposition: A | Discharge status: Home / Self Care | Payer: Self-pay | Source: Ambulatory Visit | Attending: General Surgery

## 2023-10-20 ENCOUNTER — Ambulatory Visit (HOSPITAL_COMMUNITY): Payer: Self-pay | Admitting: Medical

## 2023-10-20 ENCOUNTER — Encounter (HOSPITAL_COMMUNITY): Payer: Self-pay | Admitting: General Surgery

## 2023-10-20 ENCOUNTER — Ambulatory Visit (HOSPITAL_COMMUNITY)
Admission: RE | Admit: 2023-10-20 | Discharge: 2023-10-20 | Disposition: A | Discharge status: Home / Self Care | Source: Ambulatory Visit | Attending: General Surgery | Admitting: General Surgery

## 2023-10-20 ENCOUNTER — Other Ambulatory Visit: Payer: Self-pay

## 2023-10-20 DIAGNOSIS — Z79899 Other long term (current) drug therapy: Secondary | ICD-10-CM | POA: Diagnosis not present

## 2023-10-20 DIAGNOSIS — K603 Anal fistula, unspecified: Secondary | ICD-10-CM

## 2023-10-20 DIAGNOSIS — F32A Depression, unspecified: Secondary | ICD-10-CM | POA: Insufficient documentation

## 2023-10-20 DIAGNOSIS — K589 Irritable bowel syndrome without diarrhea: Secondary | ICD-10-CM | POA: Insufficient documentation

## 2023-10-20 DIAGNOSIS — K6289 Other specified diseases of anus and rectum: Secondary | ICD-10-CM | POA: Insufficient documentation

## 2023-10-20 DIAGNOSIS — Z9889 Other specified postprocedural states: Secondary | ICD-10-CM | POA: Diagnosis not present

## 2023-10-20 DIAGNOSIS — K219 Gastro-esophageal reflux disease without esophagitis: Secondary | ICD-10-CM | POA: Diagnosis not present

## 2023-10-20 DIAGNOSIS — Z01818 Encounter for other preprocedural examination: Secondary | ICD-10-CM

## 2023-10-20 DIAGNOSIS — Z87891 Personal history of nicotine dependence: Secondary | ICD-10-CM | POA: Diagnosis not present

## 2023-10-20 HISTORY — PX: RECTAL EXAM UNDER ANESTHESIA: SHX6399

## 2023-10-20 LAB — HCG, SERUM, QUALITATIVE: Preg, Serum: NEGATIVE

## 2023-10-20 SURGERY — EXAM UNDER ANESTHESIA, RECTUM
Anesthesia: Monitor Anesthesia Care | Site: Rectum

## 2023-10-20 MED ORDER — LIDOCAINE HCL URETHRAL/MUCOSAL 2 % EX GEL
CUTANEOUS | Status: AC
  Filled 2023-10-20: qty 30

## 2023-10-20 MED ORDER — MIDAZOLAM HCL 2 MG/2ML IJ SOLN
INTRAMUSCULAR | Status: AC
  Filled 2023-10-20: qty 2

## 2023-10-20 MED ORDER — DEXAMETHASONE SODIUM PHOSPHATE 10 MG/ML IJ SOLN
INTRAMUSCULAR | Status: DC | PRN
  Administered 2023-10-20: 10 mg via INTRAVENOUS

## 2023-10-20 MED ORDER — FENTANYL CITRATE (PF) 100 MCG/2ML IJ SOLN
INTRAMUSCULAR | Status: AC
Start: 2023-10-20 — End: 2023-10-20
  Filled 2023-10-20: qty 2

## 2023-10-20 MED ORDER — SODIUM CHLORIDE 0.9% FLUSH: 3.0000 mL | INTRAVENOUS | Status: DC | PRN

## 2023-10-20 MED ORDER — HYDROGEN PEROXIDE 3 % EX SOLN
CUTANEOUS | Status: AC
  Filled 2023-10-20: qty 473

## 2023-10-20 MED ORDER — PROPOFOL 500 MG/50ML IV EMUL
INTRAVENOUS | Status: AC
  Filled 2023-10-20: qty 50

## 2023-10-20 MED ORDER — OXYCODONE HCL 5 MG PO TABS: 5.0000 mg | ORAL_TABLET | Freq: Once | ORAL | Status: DC | PRN

## 2023-10-20 MED ORDER — ACETAMINOPHEN 650 MG RE SUPP: 650.0000 mg | RECTAL | Status: DC | PRN

## 2023-10-20 MED ORDER — PROPOFOL 500 MG/50ML IV EMUL
INTRAVENOUS | Status: DC | PRN
  Administered 2023-10-20: 125 ug/kg/min via INTRAVENOUS

## 2023-10-20 MED ORDER — CELECOXIB 200 MG PO CAPS
200.0000 mg | ORAL_CAPSULE | ORAL | Status: AC
  Administered 2023-10-20: 200 mg via ORAL
  Filled 2023-10-20: qty 1

## 2023-10-20 MED ORDER — PROPOFOL 10 MG/ML IV BOLUS
INTRAVENOUS | Status: DC | PRN
  Administered 2023-10-20: 10 mg via INTRAVENOUS
  Administered 2023-10-20: 20 mg via INTRAVENOUS

## 2023-10-20 MED ORDER — ORAL CARE MOUTH RINSE: 15.0000 mL | Freq: Once | OROMUCOSAL | Status: AC

## 2023-10-20 MED ORDER — BUPIVACAINE LIPOSOME 1.3 % IJ SUSP
INTRAMUSCULAR | Status: AC
  Filled 2023-10-20: qty 20

## 2023-10-20 MED ORDER — SODIUM CHLORIDE 0.9% FLUSH: 3.0000 mL | Freq: Two times a day (BID) | INTRAVENOUS | Status: DC

## 2023-10-20 MED ORDER — MIDAZOLAM HCL 5 MG/5ML IJ SOLN
INTRAMUSCULAR | Status: DC | PRN
  Administered 2023-10-20: 2 mg via INTRAVENOUS

## 2023-10-20 MED ORDER — LACTATED RINGERS IV SOLN: INTRAVENOUS | Status: DC

## 2023-10-20 MED ORDER — ONDANSETRON HCL 4 MG/2ML IJ SOLN: 4.0000 mg | Freq: Once | INTRAMUSCULAR | Status: DC | PRN

## 2023-10-20 MED ORDER — BUPIVACAINE-EPINEPHRINE (PF) 0.25% -1:200000 IJ SOLN
INTRAMUSCULAR | Status: AC
  Filled 2023-10-20: qty 30

## 2023-10-20 MED ORDER — ACETAMINOPHEN 500 MG PO TABS
1000.0000 mg | ORAL_TABLET | ORAL | Status: AC
  Administered 2023-10-20: 1000 mg via ORAL
  Filled 2023-10-20: qty 2

## 2023-10-20 MED ORDER — FENTANYL CITRATE (PF) 100 MCG/2ML IJ SOLN
INTRAMUSCULAR | Status: DC | PRN
  Administered 2023-10-20 (×2): 50 ug via INTRAVENOUS

## 2023-10-20 MED ORDER — BUPIVACAINE-EPINEPHRINE 0.25% -1:200000 IJ SOLN
INTRAMUSCULAR | Status: DC | PRN
  Administered 2023-10-20: 30 mL

## 2023-10-20 MED ORDER — 0.9 % SODIUM CHLORIDE (POUR BTL) OPTIME
TOPICAL | Status: DC | PRN
  Administered 2023-10-20: 1000 mL

## 2023-10-20 MED ORDER — OXYCODONE HCL 5 MG/5ML PO SOLN: 5.0000 mg | Freq: Once | ORAL | Status: DC | PRN

## 2023-10-20 MED ORDER — ACETAMINOPHEN 325 MG PO TABS: 650.0000 mg | ORAL_TABLET | ORAL | Status: DC | PRN

## 2023-10-20 MED ORDER — ONDANSETRON HCL 4 MG/2ML IJ SOLN
INTRAMUSCULAR | Status: DC | PRN
  Administered 2023-10-20: 4 mg via INTRAVENOUS

## 2023-10-20 MED ORDER — DEXMEDETOMIDINE HCL IN NACL 200 MCG/50ML IV SOLN
INTRAVENOUS | Status: DC | PRN
  Administered 2023-10-20 (×3): 4 ug via INTRAVENOUS

## 2023-10-20 MED ORDER — HYDROMORPHONE HCL 1 MG/ML IJ SOLN: 0.2500 mg | INTRAMUSCULAR | Status: DC | PRN

## 2023-10-20 MED ORDER — CHLORHEXIDINE GLUCONATE 0.12 % MT SOLN
15.0000 mL | Freq: Once | OROMUCOSAL | Status: AC
  Administered 2023-10-20: 15 mL via OROMUCOSAL

## 2023-10-20 MED ORDER — DROPERIDOL 2.5 MG/ML IJ SOLN: 0.6250 mg | Freq: Once | INTRAMUSCULAR | Status: DC | PRN

## 2023-10-20 MED ORDER — SODIUM CHLORIDE 0.9 % IV SOLN: 250.0000 mL | INTRAVENOUS | Status: DC | PRN

## 2023-10-20 SURGICAL SUPPLY — 37 items
BAG COUNTER SPONGE SURGICOUNT (BAG) IMPLANT
BENZOIN TINCTURE PRP APPL 2/3 (GAUZE/BANDAGES/DRESSINGS) ×2 IMPLANT
BLADE SURG 15 STRL LF DISP TIS (BLADE) IMPLANT
BRIEF MESH DISP LRG (UNDERPADS AND DIAPERS) ×1 IMPLANT
DRAPE LAPAROTOMY T 102X78X121 (DRAPES) IMPLANT
DRAPE LAPAROTOMY T 98X78 PEDS (DRAPES) ×2 IMPLANT
DRAPE UTILITY XL STRL (DRAPES) IMPLANT
ELECT REM PT RETURN 15FT ADLT (MISCELLANEOUS) ×2 IMPLANT
GAUZE 4X4 16PLY ~~LOC~~+RFID DBL (SPONGE) ×2 IMPLANT
GAUZE PAD ABD 8X10 STRL (GAUZE/BANDAGES/DRESSINGS) ×1 IMPLANT
GAUZE SPONGE 4X4 12PLY STRL (GAUZE/BANDAGES/DRESSINGS) IMPLANT
GLOVE BIO SURGEON STRL SZ 6.5 (GLOVE) ×2 IMPLANT
GLOVE INDICATOR 6.5 STRL GRN (GLOVE) ×4 IMPLANT
GOWN STRL REUS W/ TWL XL LVL3 (GOWN DISPOSABLE) ×4 IMPLANT
IV CATH 14GX2 1/4 (CATHETERS) IMPLANT
KIT BASIN OR (CUSTOM PROCEDURE TRAY) ×2 IMPLANT
KIT TURNOVER KIT A (KITS) ×2 IMPLANT
LOOP VESSEL MAXI BLUE (MISCELLANEOUS) ×2 IMPLANT
NDL HYPO 22X1.5 SAFETY MO (MISCELLANEOUS) ×1 IMPLANT
NEEDLE HYPO 22X1.5 SAFETY MO (MISCELLANEOUS) ×2 IMPLANT
PACK BASIC VI WITH GOWN DISP (CUSTOM PROCEDURE TRAY) ×2 IMPLANT
PACK LITHOTOMY IV (CUSTOM PROCEDURE TRAY) IMPLANT
PENCIL SMOKE EVACUATOR (MISCELLANEOUS) ×1 IMPLANT
SOLUTION SCRB POV-IOD 4OZ 7.5% (MISCELLANEOUS) ×2 IMPLANT
SPIKE FLUID TRANSFER (MISCELLANEOUS) ×1 IMPLANT
SURGILUBE 2OZ TUBE FLIPTOP (MISCELLANEOUS) ×1 IMPLANT
SUT CHROMIC 2 0 SH (SUTURE) IMPLANT
SUT CHROMIC 3 0 SH 27 (SUTURE) IMPLANT
SUT ETHIBOND 0 (SUTURE) ×2 IMPLANT
SUT MON AB 3-0 SH27 (SUTURE) ×1 IMPLANT
SUT VIC AB 4-0 P-3 18XBRD (SUTURE) IMPLANT
SUT VIC AB 4-0 PS2 18 (SUTURE) IMPLANT
SYR 20ML LL LF (SYRINGE) IMPLANT
SYR CONTROL 10ML LL (SYRINGE) ×2 IMPLANT
TOWEL OR 17X26 10 PK STRL BLUE (TOWEL DISPOSABLE) ×2 IMPLANT
YANKAUER SUCT BULB TIP 10FT TU (MISCELLANEOUS) ×2 IMPLANT
YANKAUER SUCT BULB TIP NO VENT (SUCTIONS) ×2 IMPLANT

## 2023-10-20 NOTE — Anesthesia Preprocedure Evaluation (Addendum)
 Anesthesia Evaluation  Patient identified by MRN, date of birth, ID band Patient awake    Reviewed: Allergy & Precautions, NPO status , Patient's Chart, lab work & pertinent test results  Airway Mallampati: II  TM Distance: >3 FB Neck ROM: Full    Dental  (+) Teeth Intact, Dental Advisory Given   Pulmonary former smoker   Pulmonary exam normal breath sounds clear to auscultation       Cardiovascular negative cardio ROS Normal cardiovascular exam Rhythm:Regular Rate:Normal     Neuro/Psych  PSYCHIATRIC DISORDERS  Depression    ADHDChronic EBV infection CFS    GI/Hepatic Neg liver ROS,GERD  ,,Anal fistula IBS   Endo/Other  Hx/o Hashimoto's thyroiditis  Renal/GU negative Renal ROS     Musculoskeletal negative musculoskeletal ROS (+)    Abdominal   Peds  Hematology negative hematology ROS (+)   Anesthesia Other Findings   Reproductive/Obstetrics negative OB ROS                              Anesthesia Physical Anesthesia Plan  ASA: 2  Anesthesia Plan: MAC   Post-op Pain Management: Minimal or no pain anticipated, Dilaudid  IV, Precedex  and Ofirmev  IV (intra-op)*   Induction: Intravenous  PONV Risk Score and Plan: 4 or greater and Treatment may vary due to age or medical condition, Midazolam , Ondansetron  and Dexamethasone   Airway Management Planned:   Additional Equipment: None  Intra-op Plan:   Post-operative Plan:   Informed Consent: I have reviewed the patients History and Physical, chart, labs and discussed the procedure including the risks, benefits and alternatives for the proposed anesthesia with the patient or authorized representative who has indicated his/her understanding and acceptance.     Dental advisory given  Plan Discussed with:   Anesthesia Plan Comments:          Anesthesia Quick Evaluation

## 2023-10-20 NOTE — Anesthesia Postprocedure Evaluation (Signed)
 Anesthesia Post Note  Patient: Valerie Haynes  Procedure(s) Performed: EXAM UNDER ANESTHESIA, RECTUM (Rectum)     Patient location during evaluation: PACU Anesthesia Type: MAC Level of consciousness: awake and alert and oriented Pain management: pain level controlled Vital Signs Assessment: post-procedure vital signs reviewed and stable Respiratory status: spontaneous breathing, nonlabored ventilation and respiratory function stable Cardiovascular status: stable and blood pressure returned to baseline Postop Assessment: no apparent nausea or vomiting Anesthetic complications: no   No notable events documented.  Last Vitals:  Vitals:   10/20/23 1145 10/20/23 1148  BP: 102/68 110/71  Pulse: (!) 58 62  Resp: 17 20  Temp:  (!) 36.1 C  SpO2: 100% 100%    Last Pain:  Vitals:   10/20/23 1148  TempSrc: Oral  PainSc: 0-No pain                 [name] A.

## 2023-10-20 NOTE — Transfer of Care (Signed)
 Immediate Anesthesia Transfer of Care Note  Patient: Valerie Haynes  Procedure(s) Performed: EXAM UNDER ANESTHESIA, RECTUM (Rectum)  Patient Location: PACU  Anesthesia Type:MAC  Level of Consciousness: awake, alert , and oriented  Airway & Oxygen Therapy: Patient Spontanous Breathing and Patient connected to face mask oxygen  Post-op Assessment: Report given to RN and Post -op Vital signs reviewed and stable  Post vital signs: Reviewed and stable  Last Vitals:  Vitals Value Taken Time  BP 112/61 10/20/23 11:03  Temp    Pulse 82 10/20/23 11:04  Resp 17 10/20/23 11:04  SpO2 100 % 10/20/23 11:04  Vitals shown include unfiled device data.  Last Pain:  Vitals:   10/20/23 0820  TempSrc:   PainSc: 0-No pain         Complications: No notable events documented.

## 2023-10-20 NOTE — Discharge Instructions (Addendum)

## 2023-10-20 NOTE — H&P (Signed)
 REFERRING PHYSICIAN:  Gerome Lonell BROCKS, DO   PROVIDER:  Cherylin Waguespack CHRISTINE Vivion Romano, MD   MRN: [id number] DOB: December 13, 1990    Subjective     History of Present Illness:   .  33 year old female status post I&D of a perirectal abscess in early April 2025.  She continues to have some swelling and drainage from the area.  She is here today for further evaluation.  She is currently having minimal drainage but did have some yellowish drainage approximately 1 week ago.       Past Medical History:  Diagnosis Date   Chronic Epstein Barr virus (EBV) infection     Depression     Hashimoto's thyroiditis     IBS (irritable bowel syndrome)                 Past Surgical History:  Procedure Laterality Date   NO PAST SURGERIES       WISDOM TOOTH EXTRACTION                   Family History  Problem Relation Age of Onset   Allergic rhinitis Father     Asthma Father     Rheum arthritis Father     Allergic rhinitis Brother     Asthma Brother            Social History         Socioeconomic History   Marital status: Married      Spouse name: Not on file   Number of children: Not on file   Years of education: Not on file   Highest education level: Not on file  Occupational History   Not on file  Tobacco Use   Smoking status: Former      Current packs/day: 0.00      Types: Cigarettes      Quit date: 01/27/2011      Years since quitting: 12.5      Passive exposure: Past   Smokeless tobacco: Former   Tobacco comments:      social smoking only before  Vaping Use   Vaping status: Never Used  Substance and Sexual Activity   Alcohol use: Yes      Comment: socially, rare 1/2 glass wine during pregnancy   Drug use: Yes      Types: Marijuana      Comment: history as teen   Sexual activity: Yes      Birth control/protection: None  Other Topics Concern   Not on file  Social History Narrative   Not on file    Social Drivers of Health    Financial Resource Strain: Not on file   Food Insecurity: Not on file  Transportation Needs: Not on file  Physical Activity: Not on file  Stress: Not on file  Social Connections: Not on file  Intimate Partner Violence: Not on file       Current Medications    Current Outpatient Medications:    EPINEPHrine  0.3 mg/0.3 mL IJ SOAJ injection, SMARTSIG:IM As Directed PRN, Disp: , Rfl:    VYVANSE 10 MG capsule, Take 10 mg by mouth daily., Disp: , Rfl:      Allergies      Allergies  Allergen Reactions   Milk-Related Compounds Anaphylaxis   Peanut-Containing Drug Products Anaphylaxis        Review of Systems - Negative except as stated above      Objective:      Vitals:   10/20/23 0808  BP:  124/86  Pulse: 76  Resp: 16  Temp: 97.7 F (36.5 C)  SpO2: 100%      Exam Gen: NAD CV: RRR Pulm: CTA Abd: soft Rectal: Small L posterior punctate lesion that is currently draining purulence.     Labs, Imaging and Diagnostic Testing: Previous office notes reviewed.   Assessment and Plan:  Diagnoses and all orders for this visit:   Anal fistula   Patient appears to have a healing I&D site.  She appears to have developed a fistula at this site.  I have recommended EUA with fistulotomy vs seton placement.   We discussed the typical indications and recovery times for both. All questions answered.  Risks include bleeding and pain.         Bernarda JAYSON Ned, MD Colon and Rectal Surgery [facility]

## 2023-10-20 NOTE — Op Note (Signed)
 10/20/2023  10:54 AM  PATIENT:  Valerie Haynes  33 y.o. female  Patient Care Team: Gerome Brunet, DO as PCP - General (Family Medicine)  PRE-OPERATIVE DIAGNOSIS:  ANAL FISTULA  POST-OPERATIVE DIAGNOSIS:  Normal exam  PROCEDURE: ANAL EXAM UNDER ANESTHESIA   Surgeon(s): Debby Hila, MD  ASSISTANT: none   ANESTHESIA:   local and MAC  SPECIMEN:  No Specimen  DISPOSITION OF SPECIMEN:  N/A  COUNTS:  YES  PLAN OF CARE: Discharge to home after PACU  PATIENT DISPOSITION:  PACU - hemodynamically stable.  INDICATION: 33 y.o. F with continued perirectal drainage after I&D of an abscess   OR FINDINGS: no draining fistula at previous I&D scar.  No palpable cord.  No internal openings identified   DESCRIPTION: the patient was identified in the preoperative holding area and taken to the OR where they were laid on the operating room table.  MAC anesthesia was induced without difficulty. The patient was then positioned in prone jackknife position with buttocks gently taped apart.  The patient was then prepped and draped in usual sterile fashion.  SCDs were noted to be in place prior to the initiation of anesthesia. A surgical timeout was performed indicating the correct patient, procedure, positioning and need for preoperative antibiotics.  A rectal block was performed using Marcaine  with epinephrine .    I began with a digital rectal exam. The anal canal was gently dilated.  I then placed a Hill-Ferguson anoscope into the anal canal and evaluated this completely.  There was no sign of an internal opening.  I evaluated the entire perirectal area.  There was no sign of draining lesion or subcutaneous scarring.  I palpated internally into the ischiorectal space on both sides.  No masses were noted.  I used a crypt hook to identify any occult internal openings and none were found.  I decided to stop the exam at that point.  Patient was awakened from anesthesia and sent to the PACU in stable  condition.  All counts were correct per OR staff.  Hila JAYSON Debby, MD  Colorectal and General Surgery [facility]

## 2023-10-21 ENCOUNTER — Encounter (HOSPITAL_COMMUNITY): Payer: Self-pay | Admitting: General Surgery

## 2023-11-01 ENCOUNTER — Ambulatory Visit: Payer: Self-pay | Admitting: General Surgery

## 2023-11-01 NOTE — H&P (View-Only) (Signed)
 PROVIDER:  Sherell Haynes Valerie Cyril Railey, MD  MRN: [id number] DOB: 08/21/1990 DATE OF ENCOUNTER: 11/01/2023 Interval History:  Reports continued rectal drainage after I&D of perirectal abscess.  Pt s/p neg EUA.  Reports some additional drainage and pain last week.  Has had continued drainage this week as well. Past Medical History:  Diagnosis Date   ADHD (attention deficit hyperactivity disorder)    Chronic Epstein Barr virus (EBV) infection 2015   fatigue, joint pain, but is better since changed diet, exercise, etc.   Depression    GERD (gastroesophageal reflux disease)    Hashimoto's thyroiditis    IBS (irritable bowel syndrome)    Medical history non-contributory     Past Surgical History:  Procedure Laterality Date   NO PAST SURGERIES     RECTAL EXAM UNDER ANESTHESIA  10/20/2023   Procedure: EXAM UNDER ANESTHESIA, RECTUM;  Surgeon: Debby Hila, MD;  Location: [facility];  Service: General;;   WISDOM TOOTH EXTRACTION      Family History  Problem Relation Age of Onset   Allergic rhinitis Father    Asthma Father    Rheum arthritis Father    Allergic rhinitis Brother    Asthma Brother     Social History   Socioeconomic History   Marital status: Married    Spouse name: Not on file   Number of children: Not on file   Years of education: Not on file   Highest education level: Not on file  Occupational History   Not on file  Tobacco Use   Smoking status: Former    Current packs/day: 0.00    Types: Cigarettes    Quit date: 01/27/2011    Years since quitting: 12.7    Passive exposure: Past   Smokeless tobacco: Former   Tobacco comments:    social smoking only before  Vaping Use   Vaping status: Never Used  Substance and Sexual Activity   Alcohol use: Not Currently   Drug use: Not Currently    Types: Marijuana    Comment: history as teen   Sexual activity: Yes    Birth control/protection: None  Other Topics Concern   Not on file  Social History Narrative   Not on  file   Social Drivers of Health   Financial Resource Strain: Not on file  Food Insecurity: Not on file  Transportation Needs: Not on file  Physical Activity: Not on file  Stress: Not on file  Social Connections: Not on file  Intimate Partner Violence: Not on file     Current Outpatient Medications:    acetaminophen  (TYLENOL ) 500 MG tablet, Take 500 mg by mouth every 6 (six) hours as needed (pain)., Disp: , Rfl:    EPINEPHrine  0.3 mg/0.3 mL IJ SOAJ injection, SMARTSIG:IM As Directed PRN, Disp: , Rfl:    ibuprofen  (ADVIL ) 200 MG tablet, Take 200 mg by mouth every 8 (eight) hours as needed (pain.)., Disp: , Rfl:    lisdexamfetamine (VYVANSE) 20 MG capsule, Take 20 mg by mouth in the morning., Disp: , Rfl:    Misc Natural Products (SUPER GREENS PO), Take 1 Dose by mouth daily. Juce Daily Superfood, Disp: , Rfl:   Allergies  Allergen Reactions   Milk-Related Compounds Anaphylaxis   Peanut-Containing Drug Products Anaphylaxis    Review of Systems - Negative except as stated above   Physical Examination:  Gen: NAD  Rectal: punctate lesion at posterior midline proximal to scar, overlying intersphincteric groove, probe inserts at least 1 cm deep  Assessment  and Plan:  Valerie Haynes is a 33 y.o. female who underwent neg EUA for anal fistula. Unfortunately, she has developed a recurrence.  We have discussed returning to the OR for repeat exam and treatment.    Diagnoses and all orders for this visit:  Anal fistula       The plan was discussed in detail with the patient today, who expressed understanding.  The patient has my contact information, and understands to call me with any additional questions or concerns in the interval.  I would be happy to see the patient back sooner if the need arises.   Donnalyn Juran C Quandre Polinski, MD

## 2023-11-01 NOTE — H&P (Signed)
 PROVIDER:  Sherell Christoffel Valerie Cyril Railey, Valerie Haynes  MRN: [id number] DOB: 08/21/1990 DATE OF ENCOUNTER: 11/01/2023 Interval History:  Reports continued rectal drainage after I&D of perirectal abscess.  Pt s/p neg EUA.  Reports some additional drainage and pain last week.  Has had continued drainage this week as well. Past Medical History:  Diagnosis Date   ADHD (attention deficit hyperactivity disorder)    Chronic Epstein Barr virus (EBV) infection 2015   fatigue, joint pain, but is better since changed diet, exercise, etc.   Depression    GERD (gastroesophageal reflux disease)    Hashimoto's thyroiditis    IBS (irritable bowel syndrome)    Medical history non-contributory     Past Surgical History:  Procedure Laterality Date   NO PAST SURGERIES     RECTAL EXAM UNDER ANESTHESIA  10/20/2023   Procedure: EXAM UNDER ANESTHESIA, RECTUM;  Surgeon: Debby Hila, Valerie Haynes;  Location: [facility];  Service: General;;   WISDOM TOOTH EXTRACTION      Family History  Problem Relation Age of Onset   Allergic rhinitis Father    Asthma Father    Rheum arthritis Father    Allergic rhinitis Brother    Asthma Brother     Social History   Socioeconomic History   Marital status: Married    Spouse name: Not on file   Number of children: Not on file   Years of education: Not on file   Highest education level: Not on file  Occupational History   Not on file  Tobacco Use   Smoking status: Former    Current packs/day: 0.00    Types: Cigarettes    Quit date: 01/27/2011    Years since quitting: 12.7    Passive exposure: Past   Smokeless tobacco: Former   Tobacco comments:    social smoking only before  Vaping Use   Vaping status: Never Used  Substance and Sexual Activity   Alcohol use: Not Currently   Drug use: Not Currently    Types: Marijuana    Comment: history as teen   Sexual activity: Yes    Birth control/protection: None  Other Topics Concern   Not on file  Social History Narrative   Not on  file   Social Drivers of Health   Financial Resource Strain: Not on file  Food Insecurity: Not on file  Transportation Needs: Not on file  Physical Activity: Not on file  Stress: Not on file  Social Connections: Not on file  Intimate Partner Violence: Not on file     Current Outpatient Medications:    acetaminophen  (TYLENOL ) 500 MG tablet, Take 500 mg by mouth every 6 (six) hours as needed (pain)., Disp: , Rfl:    EPINEPHrine  0.3 mg/0.3 mL IJ SOAJ injection, SMARTSIG:IM As Directed PRN, Disp: , Rfl:    ibuprofen  (ADVIL ) 200 MG tablet, Take 200 mg by mouth every 8 (eight) hours as needed (pain.)., Disp: , Rfl:    lisdexamfetamine (VYVANSE) 20 MG capsule, Take 20 mg by mouth in the morning., Disp: , Rfl:    Misc Natural Products (SUPER GREENS PO), Take 1 Dose by mouth daily. Juce Daily Superfood, Disp: , Rfl:   Allergies  Allergen Reactions   Milk-Related Compounds Anaphylaxis   Peanut-Containing Drug Products Anaphylaxis    Review of Systems - Negative except as stated above   Physical Examination:  Gen: NAD  Rectal: punctate lesion at posterior midline proximal to scar, overlying intersphincteric groove, probe inserts at least 1 cm deep  Assessment  and Plan:  Valerie Haynes is a 33 y.o. female who underwent neg EUA for anal fistula. Unfortunately, she has developed a recurrence.  We have discussed returning to the OR for repeat exam and treatment.    Diagnoses and all orders for this visit:  Anal fistula       The plan was discussed in detail with the patient today, who expressed understanding.  The patient has my contact information, and understands to call me with any additional questions or concerns in the interval.  I would be happy to see the patient back sooner if the need arises.   Donnalyn Juran C Quandre Polinski, Valerie Haynes

## 2023-11-04 ENCOUNTER — Other Ambulatory Visit: Payer: Self-pay

## 2023-11-04 ENCOUNTER — Encounter (HOSPITAL_COMMUNITY): Payer: Self-pay | Admitting: General Surgery

## 2023-11-04 NOTE — Progress Notes (Addendum)
 Anesthesia Review:  PCP:  Lonell Collet  Cardiologist : none   PPM/ ICD: Device Orders: Rep Notified:  Chest x-ray : EKG : Echo : Stress test: Cardiac Cath :   Activity level: can do a flight of stairs without difficulty  Sleep Study/ CPAP : none  Fasting Blood Sugar :      / Checks Blood Sugar -- times a day:    Blood Thinner/ Instructions /Last Dose: ASA / Instructions/ Last Dose :    11/04/2023 Med hx updated as needed and preop instructons completed for surgery on 11/10/23.  PT aware to shower nite before and am of surgery with Dial soap.    Pt has had continued bleeding since surgery of 10/20/18/25.  Unable to note if has had period since 09/2023 due to bleeding.

## 2023-11-10 ENCOUNTER — Encounter (HOSPITAL_COMMUNITY): Payer: Self-pay | Admitting: General Surgery

## 2023-11-10 ENCOUNTER — Encounter (HOSPITAL_COMMUNITY)
Admission: RE | Disposition: A | Discharge status: Home / Self Care | Payer: Self-pay | Source: Home / Self Care | Attending: General Surgery

## 2023-11-10 ENCOUNTER — Ambulatory Visit (HOSPITAL_COMMUNITY)
Admission: RE | Admit: 2023-11-10 | Discharge: 2023-11-10 | Disposition: A | Discharge status: Home / Self Care | Attending: General Surgery | Admitting: General Surgery

## 2023-11-10 ENCOUNTER — Encounter (HOSPITAL_COMMUNITY): Payer: Self-pay | Admitting: Anesthesiology

## 2023-11-10 ENCOUNTER — Ambulatory Visit (HOSPITAL_COMMUNITY): Payer: Self-pay | Admitting: Anesthesiology

## 2023-11-10 DIAGNOSIS — K219 Gastro-esophageal reflux disease without esophagitis: Secondary | ICD-10-CM | POA: Diagnosis not present

## 2023-11-10 DIAGNOSIS — Z01818 Encounter for other preprocedural examination: Secondary | ICD-10-CM

## 2023-11-10 DIAGNOSIS — Z87891 Personal history of nicotine dependence: Secondary | ICD-10-CM | POA: Diagnosis not present

## 2023-11-10 DIAGNOSIS — K603 Anal fistula, unspecified: Secondary | ICD-10-CM | POA: Diagnosis present

## 2023-11-10 DIAGNOSIS — F909 Attention-deficit hyperactivity disorder, unspecified type: Secondary | ICD-10-CM | POA: Insufficient documentation

## 2023-11-10 HISTORY — PX: ANAL FISTULOTOMY: SHX6423

## 2023-11-10 LAB — CBC
HCT: 38.2 % (ref 36.0–46.0)
Hemoglobin: 12.4 g/dL (ref 12.0–15.0)
MCH: 29.2 pg (ref 26.0–34.0)
MCHC: 32.5 g/dL (ref 30.0–36.0)
MCV: 89.9 fL (ref 80.0–100.0)
Platelets: 200 K/uL (ref 150–400)
RBC: 4.25 MIL/uL (ref 3.87–5.11)
RDW: 12.8 % (ref 11.5–15.5)
WBC: 6.4 K/uL (ref 4.0–10.5)
nRBC: 0 % (ref 0.0–0.2)

## 2023-11-10 LAB — POCT PREGNANCY, URINE: Preg Test, Ur: NEGATIVE

## 2023-11-10 SURGERY — ANAL FISTULOTOMY
Anesthesia: General

## 2023-11-10 MED ORDER — ACETAMINOPHEN 650 MG RE SUPP: 650.0000 mg | RECTAL | Status: DC | PRN

## 2023-11-10 MED ORDER — CHLORHEXIDINE GLUCONATE 0.12 % MT SOLN
15.0000 mL | Freq: Once | OROMUCOSAL | Status: AC
  Administered 2023-11-10: 15 mL via OROMUCOSAL

## 2023-11-10 MED ORDER — MIDAZOLAM HCL 2 MG/2ML IJ SOLN
INTRAMUSCULAR | Status: DC | PRN
  Administered 2023-11-10: 2 mg via INTRAVENOUS

## 2023-11-10 MED ORDER — SODIUM CHLORIDE 0.9 % IV SOLN: 250.0000 mL | INTRAVENOUS | Status: DC | PRN

## 2023-11-10 MED ORDER — OXYCODONE HCL 5 MG PO TABS: 5.0000 mg | ORAL_TABLET | ORAL | Status: DC | PRN

## 2023-11-10 MED ORDER — ONDANSETRON HCL 4 MG/2ML IJ SOLN: 4.0000 mg | Freq: Once | INTRAMUSCULAR | Status: DC | PRN

## 2023-11-10 MED ORDER — LIDOCAINE 2% (20 MG/ML) 5 ML SYRINGE
INTRAMUSCULAR | Status: DC | PRN
  Administered 2023-11-10: 100 mg via INTRAVENOUS

## 2023-11-10 MED ORDER — ACETAMINOPHEN 325 MG PO TABS: 650.0000 mg | ORAL_TABLET | ORAL | Status: DC | PRN

## 2023-11-10 MED ORDER — AMISULPRIDE (ANTIEMETIC) 5 MG/2ML IV SOLN: 10.0000 mg | Freq: Once | INTRAVENOUS | Status: DC | PRN

## 2023-11-10 MED ORDER — PROPOFOL 10 MG/ML IV BOLUS
INTRAVENOUS | Status: DC | PRN
  Administered 2023-11-10: 30 mg via INTRAVENOUS
  Administered 2023-11-10: 50 mg via INTRAVENOUS

## 2023-11-10 MED ORDER — SODIUM CHLORIDE 0.9% FLUSH: 3.0000 mL | Freq: Two times a day (BID) | INTRAVENOUS | Status: DC

## 2023-11-10 MED ORDER — FENTANYL CITRATE (PF) 100 MCG/2ML IJ SOLN
INTRAMUSCULAR | Status: AC
  Filled 2023-11-10: qty 2

## 2023-11-10 MED ORDER — SODIUM CHLORIDE 0.9% FLUSH: 3.0000 mL | INTRAVENOUS | Status: DC | PRN

## 2023-11-10 MED ORDER — BUPIVACAINE-EPINEPHRINE 0.5% -1:200000 IJ SOLN
INTRAMUSCULAR | Status: DC | PRN
  Administered 2023-11-10: 30 mL

## 2023-11-10 MED ORDER — BUPIVACAINE LIPOSOME 1.3 % IJ SUSP
INTRAMUSCULAR | Status: DC | PRN
  Administered 2023-11-10: 10 mL

## 2023-11-10 MED ORDER — LACTATED RINGERS IV SOLN
INTRAVENOUS | Status: DC
Start: 2023-11-10 — End: 2023-11-10

## 2023-11-10 MED ORDER — CELECOXIB 200 MG PO CAPS
200.0000 mg | ORAL_CAPSULE | ORAL | Status: AC
Start: 2023-11-10 — End: 2023-11-10
  Administered 2023-11-10: 200 mg via ORAL
  Filled 2023-11-10: qty 1

## 2023-11-10 MED ORDER — ORAL CARE MOUTH RINSE: 15.0000 mL | Freq: Once | OROMUCOSAL | Status: AC

## 2023-11-10 MED ORDER — FENTANYL CITRATE PF 50 MCG/ML IJ SOSY: 25.0000 ug | PREFILLED_SYRINGE | INTRAMUSCULAR | Status: DC | PRN

## 2023-11-10 MED ORDER — BUPIVACAINE-EPINEPHRINE (PF) 0.5% -1:200000 IJ SOLN
INTRAMUSCULAR | Status: AC
  Filled 2023-11-10: qty 30

## 2023-11-10 MED ORDER — BUPIVACAINE LIPOSOME 1.3 % IJ SUSP
INTRAMUSCULAR | Status: AC
  Filled 2023-11-10: qty 10

## 2023-11-10 MED ORDER — MIDAZOLAM HCL 2 MG/2ML IJ SOLN
INTRAMUSCULAR | Status: AC
  Filled 2023-11-10: qty 2

## 2023-11-10 MED ORDER — ACETAMINOPHEN 500 MG PO TABS
1000.0000 mg | ORAL_TABLET | ORAL | Status: AC
Start: 2023-11-10 — End: 2023-11-10
  Administered 2023-11-10: 1000 mg via ORAL
  Filled 2023-11-10: qty 2

## 2023-11-10 MED ORDER — TRAMADOL HCL 50 MG PO TABS: 50.0000 mg | ORAL_TABLET | Freq: Four times a day (QID) | ORAL | 0 refills | Status: AC | PRN

## 2023-11-10 MED ORDER — PROPOFOL 500 MG/50ML IV EMUL
INTRAVENOUS | Status: DC | PRN
  Administered 2023-11-10: 50 ug/kg/min via INTRAVENOUS

## 2023-11-10 MED ORDER — FENTANYL CITRATE (PF) 250 MCG/5ML IJ SOLN
INTRAMUSCULAR | Status: DC | PRN
  Administered 2023-11-10: 100 ug via INTRAVENOUS

## 2023-11-10 SURGICAL SUPPLY — 24 items
BAG COUNTER SPONGE SURGICOUNT (BAG) IMPLANT
BENZOIN TINCTURE PRP APPL 2/3 (GAUZE/BANDAGES/DRESSINGS) ×1 IMPLANT
BNDG GAUZE DERMACEA FLUFF 4 (GAUZE/BANDAGES/DRESSINGS) IMPLANT
BRIEF MESH DISP LRG (UNDERPADS AND DIAPERS) ×1 IMPLANT
DRAPE LAPAROTOMY T 98X78 PEDS (DRAPES) ×1 IMPLANT
GAUZE 4X4 16PLY ~~LOC~~+RFID DBL (SPONGE) IMPLANT
GAUZE PAD ABD 8X10 STRL (GAUZE/BANDAGES/DRESSINGS) ×1 IMPLANT
GAUZE SPONGE 4X4 12PLY STRL (GAUZE/BANDAGES/DRESSINGS) IMPLANT
GLOVE BIO SURGEON STRL SZ 6.5 (GLOVE) ×1 IMPLANT
GLOVE INDICATOR 6.5 STRL GRN (GLOVE) ×2 IMPLANT
GOWN STRL REUS W/ TWL XL LVL3 (GOWN DISPOSABLE) ×2 IMPLANT
IV CATH 14GX2 1/4 (CATHETERS) IMPLANT
KIT BASIN OR (CUSTOM PROCEDURE TRAY) ×1 IMPLANT
LOOP VESSEL MAXI BLUE (MISCELLANEOUS) IMPLANT
NDL HYPO 22X1.5 SAFETY MO (MISCELLANEOUS) ×1 IMPLANT
NEEDLE HYPO 22X1.5 SAFETY MO (MISCELLANEOUS) ×1 IMPLANT
PACK BASIC VI WITH GOWN DISP (CUSTOM PROCEDURE TRAY) ×1 IMPLANT
PENCIL SMOKE EVACUATOR (MISCELLANEOUS) IMPLANT
SUT CHROMIC 3 0 SH 27 (SUTURE) IMPLANT
SUT ETHIBOND 0 (SUTURE) IMPLANT
SUT VIC AB 4-0 P-3 18XBRD (SUTURE) IMPLANT
SYR CONTROL 10ML LL (SYRINGE) ×1 IMPLANT
TOWEL OR 17X26 10 PK STRL BLUE (TOWEL DISPOSABLE) ×1 IMPLANT
YANKAUER SUCT BULB TIP NO VENT (SUCTIONS) ×1 IMPLANT

## 2023-11-10 NOTE — Discharge Instructions (Addendum)

## 2023-11-10 NOTE — Anesthesia Preprocedure Evaluation (Addendum)
 Anesthesia Evaluation  Patient identified by MRN, date of birth, ID band Patient awake    Reviewed: Allergy & Precautions, NPO status , Patient's Chart, lab work & pertinent test results  Airway Mallampati: II  TM Distance: >3 FB Neck ROM: Full    Dental  (+) Teeth Intact, Dental Advisory Given   Pulmonary former smoker   Pulmonary exam normal breath sounds clear to auscultation       Cardiovascular negative cardio ROS Normal cardiovascular exam Rhythm:Regular Rate:Normal     Neuro/Psych  PSYCHIATRIC DISORDERS  Depression    negative neurological ROS     GI/Hepatic Neg liver ROS,GERD  ,,ANAL FISTULA   Endo/Other  negative endocrine ROS    Renal/GU negative Renal ROS     Musculoskeletal negative musculoskeletal ROS (+)    Abdominal   Peds  (+) ADHD Hematology negative hematology ROS (+)   Anesthesia Other Findings Day of surgery medications reviewed with the patient.  Reproductive/Obstetrics                              Anesthesia Physical Anesthesia Plan  ASA: 2  Anesthesia Plan: MAC   Post-op Pain Management: Tylenol  PO (pre-op)* and Celebrex  PO (pre-op)*   Induction: Intravenous  PONV Risk Score and Plan: 2 and TIVA, Treatment may vary due to age or medical condition and Midazolam   Airway Management Planned: Natural Airway and Simple Face Mask  Additional Equipment:   Intra-op Plan:   Post-operative Plan:   Informed Consent: I have reviewed the patients History and Physical, chart, labs and discussed the procedure including the risks, benefits and alternatives for the proposed anesthesia with the patient or authorized representative who has indicated his/her understanding and acceptance.     Dental advisory given  Plan Discussed with: CRNA  Anesthesia Plan Comments:         Anesthesia Quick Evaluation

## 2023-11-10 NOTE — Interval H&P Note (Signed)
 History and Physical Interval Note:  11/10/2023 2:21 PM  Valerie Haynes  has presented today for surgery, with the diagnosis of ANAL FISTULA.  The various methods of treatment have been discussed with the patient and family. After consideration of risks, benefits and other options for treatment, the patient has consented to  Procedure(s) with comments: ANAL FISTULOTOMY (N/A) - INTERROGATION OF FISTULA TRACT PLACEMENT, SETON (N/A) as a surgical intervention.  The patient's history has been reviewed, patient examined, no change in status, stable for surgery.  I have reviewed the patient's chart and labs.  Questions were answered to the patient's satisfaction.     Bernarda JAYSON Ned, MD  Colorectal and General Surgery [facility]

## 2023-11-10 NOTE — Transfer of Care (Signed)
 Immediate Anesthesia Transfer of Care Note  Patient: Valerie Haynes  Procedure(s) Performed: ANAL FISTULOTOMY  Patient Location: PACU  Anesthesia Type:MAC  Level of Consciousness: drowsy and patient cooperative  Airway & Oxygen Therapy: Patient Spontanous Breathing  Post-op Assessment: Report given to RN and Post -op Vital signs reviewed and stable  Post vital signs: Reviewed and stable  Last Vitals:  Vitals Value Taken Time  BP 120/71 11/10/23 18:07  Temp    Pulse 90 11/10/23 18:08  Resp 11 11/10/23 18:08  SpO2 100 % 11/10/23 18:08  Vitals shown include unfiled device data.  Last Pain:  Vitals:   11/10/23 1536  TempSrc:   PainSc: 0-No pain         Complications: No notable events documented.

## 2023-11-10 NOTE — Op Note (Signed)
 11/10/2023  5:58 PM  PATIENT:  Valerie Haynes  33 y.o. female  Patient Care Team: Gerome Brunet, DO as PCP - General (Family Medicine)  PRE-OPERATIVE DIAGNOSIS:  ANAL FISTULA  POST-OPERATIVE DIAGNOSIS:  ANAL FISTULA  PROCEDURE:  ANAL FISTULOTOMY     Surgeon(s): Debby Hila, MD  ASSISTANT: none   ANESTHESIA:   local and MAC  SPECIMEN:  No Specimen  DISPOSITION OF SPECIMEN:  N/A  COUNTS:  YES  PLAN OF CARE: Discharge to home after PACU  PATIENT DISPOSITION:  PACU - hemodynamically stable.  INDICATION: 33 y.o. F with recurrent perirectal abscess.   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  MAC anesthesia was induced without difficulty. The patient was then positioned in prone jackknife position with buttocks gently taped apart.  The patient was then prepped and draped in usual sterile fashion.  SCDs were noted to be in place prior to the initiation of anesthesia. A surgical timeout was performed indicating the correct patient, procedure, positioning and need for preoperative antibiotics.  A rectal block was performed using Marcaine  with epinephrine  mixed with Experel.    I began with a digital rectal exam.  The anal canal was gently dilated.  I then placed a Hill-Ferguson anoscope into the anal canal and evaluated this completely.  The fistula probe was inserted into the posterior midline opening.  This exited internally well above the internal sphincter.  There was only a small amount of overlying sphincter.  A fistulotomy was performed.  This was then marsupialized using a 3-0 Chromic suture.  Additional local anesthesia was placed for post op pain control.  A dressing was applied and the patient was awakened form anesthesia and sent to the PACU in stable condition.  All counts were  correct per OR staff.  Hila JAYSON Debby, MD  Colorectal and General Surgery [facility] Surgery

## 2023-11-10 NOTE — Anesthesia Postprocedure Evaluation (Signed)
 Anesthesia Post Note  Patient: Valerie Haynes  Procedure(s) Performed: ANAL FISTULOTOMY     Patient location during evaluation: PACU Anesthesia Type: General Level of consciousness: awake and alert Pain management: pain level controlled Vital Signs Assessment: post-procedure vital signs reviewed and stable Respiratory status: spontaneous breathing, nonlabored ventilation and respiratory function stable Cardiovascular status: stable and blood pressure returned to baseline Postop Assessment: no apparent nausea or vomiting Anesthetic complications: no   No notable events documented.  Last Vitals:  Vitals:   11/10/23 1807 11/10/23 1815  BP: 120/71 (!) 110/55  Pulse: 92 87  Resp: 10 14  Temp: (!) 36.4 C   SpO2: 100% 100%    Last Pain:  Vitals:   11/10/23 1815  TempSrc:   PainSc: 0-No pain                 Garnette FORBES Skillern

## 2023-11-10 NOTE — Progress Notes (Signed)
 Per Dr Corinne, patient can have clear liquids until 1000 am

## 2023-11-11 ENCOUNTER — Encounter (HOSPITAL_COMMUNITY): Payer: Self-pay | Admitting: General Surgery

## 2023-12-03 ENCOUNTER — Encounter (HOSPITAL_COMMUNITY): Payer: Self-pay | Admitting: General Surgery
